# Patient Record
Sex: Female | Born: 1955 | Race: White | Hispanic: No | Marital: Married | Smoking: Never smoker
Health system: Southern US, Community
[De-identification: ages and names within clinical notes are randomized; demographics above are authoritative.]

## PROBLEM LIST (undated history)

## (undated) ENCOUNTER — Ambulatory Visit: Admission: EM

## (undated) DIAGNOSIS — J310 Chronic rhinitis: Secondary | ICD-10-CM

## (undated) DIAGNOSIS — Z8639 Personal history of other endocrine, nutritional and metabolic disease: Secondary | ICD-10-CM

## (undated) DIAGNOSIS — E7800 Pure hypercholesterolemia, unspecified: Secondary | ICD-10-CM

## (undated) DIAGNOSIS — I10 Essential (primary) hypertension: Secondary | ICD-10-CM

## (undated) DIAGNOSIS — E663 Overweight: Secondary | ICD-10-CM

## (undated) DIAGNOSIS — R002 Palpitations: Secondary | ICD-10-CM

## (undated) DIAGNOSIS — R0602 Shortness of breath: Secondary | ICD-10-CM

## (undated) DIAGNOSIS — L719 Rosacea, unspecified: Secondary | ICD-10-CM

## (undated) DIAGNOSIS — I517 Cardiomegaly: Secondary | ICD-10-CM

## (undated) DIAGNOSIS — M2550 Pain in unspecified joint: Secondary | ICD-10-CM

## (undated) DIAGNOSIS — Z862 Personal history of diseases of the blood and blood-forming organs and certain disorders involving the immune mechanism: Secondary | ICD-10-CM

## (undated) DIAGNOSIS — M791 Myalgia, unspecified site: Secondary | ICD-10-CM

## (undated) DIAGNOSIS — M1990 Unspecified osteoarthritis, unspecified site: Secondary | ICD-10-CM

## (undated) DIAGNOSIS — M609 Myositis, unspecified: Secondary | ICD-10-CM

## (undated) DIAGNOSIS — M6208 Separation of muscle (nontraumatic), other site: Secondary | ICD-10-CM

## (undated) DIAGNOSIS — E559 Vitamin D deficiency, unspecified: Secondary | ICD-10-CM

## (undated) DIAGNOSIS — R609 Edema, unspecified: Secondary | ICD-10-CM

## (undated) DIAGNOSIS — F439 Reaction to severe stress, unspecified: Secondary | ICD-10-CM

## (undated) DIAGNOSIS — E039 Hypothyroidism, unspecified: Secondary | ICD-10-CM

## (undated) DIAGNOSIS — E079 Disorder of thyroid, unspecified: Secondary | ICD-10-CM

## (undated) HISTORY — DX: Reaction to severe stress, unspecified: F43.9

## (undated) HISTORY — DX: Shortness of breath: R06.02

## (undated) HISTORY — PX: HAND SURGERY: SHX662

## (undated) HISTORY — PX: FOOT SURGERY: SHX648

## (undated) HISTORY — DX: Personal history of diseases of the blood and blood-forming organs and certain disorders involving the immune mechanism: Z86.2

## (undated) HISTORY — DX: Vitamin D deficiency, unspecified: E55.9

## (undated) HISTORY — DX: Disorder of thyroid, unspecified: E07.9

## (undated) HISTORY — DX: Myalgia, unspecified site: M79.10

## (undated) HISTORY — DX: Edema, unspecified: R60.9

## (undated) HISTORY — DX: Essential (primary) hypertension: I10

## (undated) HISTORY — PX: CERVICAL FUSION: SHX112

## (undated) HISTORY — DX: Palpitations: R00.2

## (undated) HISTORY — DX: Personal history of other endocrine, nutritional and metabolic disease: Z86.39

## (undated) HISTORY — DX: Unspecified osteoarthritis, unspecified site: M19.90

## (undated) HISTORY — DX: Rosacea, unspecified: L71.9

## (undated) HISTORY — DX: Morbid (severe) obesity due to excess calories: E66.01

## (undated) HISTORY — DX: Pain in unspecified joint: M25.50

## (undated) HISTORY — PX: TUBAL LIGATION: SHX77

## (undated) HISTORY — DX: Separation of muscle (nontraumatic), other site: M62.08

## (undated) HISTORY — DX: Chronic rhinitis: J31.0

## (undated) HISTORY — DX: Hypothyroidism, unspecified: E03.9

## (undated) HISTORY — DX: Myositis, unspecified: M60.9

## (undated) HISTORY — DX: Pure hypercholesterolemia, unspecified: E78.00

## (undated) HISTORY — PX: CHOLECYSTECTOMY: SHX55

## (undated) HISTORY — DX: Cardiomegaly: I51.7

## (undated) HISTORY — DX: Overweight: E66.3

---

## 1998-06-23 ENCOUNTER — Ambulatory Visit (HOSPITAL_BASED_OUTPATIENT_CLINIC_OR_DEPARTMENT_OTHER): Admission: RE | Admit: 1998-06-23 | Discharge: 1998-06-23 | Payer: Self-pay | Admitting: Orthopedic Surgery

## 1999-07-04 ENCOUNTER — Encounter: Payer: Self-pay | Admitting: Emergency Medicine

## 1999-07-04 ENCOUNTER — Encounter: Admission: RE | Admit: 1999-07-04 | Discharge: 1999-07-04 | Payer: Self-pay | Admitting: Emergency Medicine

## 2000-02-05 ENCOUNTER — Encounter: Admission: RE | Admit: 2000-02-05 | Discharge: 2000-02-05 | Payer: Self-pay | Admitting: Emergency Medicine

## 2000-02-07 ENCOUNTER — Encounter: Payer: Self-pay | Admitting: Emergency Medicine

## 2000-02-07 ENCOUNTER — Encounter: Admission: RE | Admit: 2000-02-07 | Discharge: 2000-02-07 | Payer: Self-pay | Admitting: Emergency Medicine

## 2000-03-03 ENCOUNTER — Ambulatory Visit (HOSPITAL_BASED_OUTPATIENT_CLINIC_OR_DEPARTMENT_OTHER): Admission: RE | Admit: 2000-03-03 | Discharge: 2000-03-03 | Payer: Self-pay | Admitting: Otolaryngology

## 2000-08-29 ENCOUNTER — Encounter (INDEPENDENT_AMBULATORY_CARE_PROVIDER_SITE_OTHER): Payer: Self-pay

## 2000-08-29 ENCOUNTER — Ambulatory Visit (HOSPITAL_COMMUNITY): Admission: RE | Admit: 2000-08-29 | Discharge: 2000-08-29 | Payer: Self-pay | Admitting: Obstetrics and Gynecology

## 2001-01-07 ENCOUNTER — Encounter: Payer: Self-pay | Admitting: Emergency Medicine

## 2001-01-07 ENCOUNTER — Encounter: Admission: RE | Admit: 2001-01-07 | Discharge: 2001-01-07 | Payer: Self-pay | Admitting: Emergency Medicine

## 2001-11-20 ENCOUNTER — Encounter: Payer: Self-pay | Admitting: Emergency Medicine

## 2001-11-20 ENCOUNTER — Encounter: Admission: RE | Admit: 2001-11-20 | Discharge: 2001-11-20 | Payer: Self-pay | Admitting: Emergency Medicine

## 2002-10-06 ENCOUNTER — Encounter (HOSPITAL_COMMUNITY): Admission: RE | Admit: 2002-10-06 | Discharge: 2003-01-04 | Payer: Self-pay | Admitting: Emergency Medicine

## 2002-10-07 ENCOUNTER — Encounter: Payer: Self-pay | Admitting: Emergency Medicine

## 2002-12-16 ENCOUNTER — Other Ambulatory Visit: Admission: RE | Admit: 2002-12-16 | Discharge: 2002-12-16 | Payer: Self-pay | Admitting: Obstetrics and Gynecology

## 2003-08-16 ENCOUNTER — Encounter: Admission: RE | Admit: 2003-08-16 | Discharge: 2003-08-16 | Payer: Self-pay | Admitting: Emergency Medicine

## 2003-10-12 ENCOUNTER — Encounter: Admission: RE | Admit: 2003-10-12 | Discharge: 2003-10-12 | Payer: Self-pay | Admitting: Emergency Medicine

## 2003-12-06 ENCOUNTER — Other Ambulatory Visit: Admission: RE | Admit: 2003-12-06 | Discharge: 2003-12-06 | Payer: Self-pay | Admitting: Obstetrics and Gynecology

## 2004-06-05 ENCOUNTER — Ambulatory Visit (HOSPITAL_COMMUNITY): Admission: RE | Admit: 2004-06-05 | Discharge: 2004-06-05 | Payer: Self-pay | Admitting: Family Medicine

## 2004-07-22 ENCOUNTER — Ambulatory Visit (HOSPITAL_COMMUNITY): Admission: RE | Admit: 2004-07-22 | Discharge: 2004-07-22 | Payer: Self-pay | Admitting: Family Medicine

## 2005-01-17 ENCOUNTER — Other Ambulatory Visit: Admission: RE | Admit: 2005-01-17 | Discharge: 2005-01-17 | Payer: Self-pay | Admitting: Obstetrics and Gynecology

## 2005-03-05 ENCOUNTER — Encounter: Admission: RE | Admit: 2005-03-05 | Discharge: 2005-03-05 | Payer: Self-pay | Admitting: *Deleted

## 2005-07-25 ENCOUNTER — Ambulatory Visit (HOSPITAL_COMMUNITY): Admission: RE | Admit: 2005-07-25 | Discharge: 2005-07-26 | Payer: Self-pay | Admitting: Neurosurgery

## 2005-12-24 ENCOUNTER — Encounter: Admission: RE | Admit: 2005-12-24 | Discharge: 2005-12-24 | Payer: Self-pay | Admitting: Endocrinology

## 2006-01-25 ENCOUNTER — Encounter: Admission: RE | Admit: 2006-01-25 | Discharge: 2006-01-25 | Payer: Self-pay | Admitting: Endocrinology

## 2007-11-20 ENCOUNTER — Encounter: Payer: Self-pay | Admitting: Cardiology

## 2008-04-27 ENCOUNTER — Encounter: Admission: RE | Admit: 2008-04-27 | Discharge: 2008-04-27 | Payer: Self-pay | Admitting: Family Medicine

## 2009-12-20 ENCOUNTER — Ambulatory Visit: Payer: Self-pay | Admitting: Cardiology

## 2009-12-20 DIAGNOSIS — Z8639 Personal history of other endocrine, nutritional and metabolic disease: Secondary | ICD-10-CM | POA: Insufficient documentation

## 2009-12-20 DIAGNOSIS — R0602 Shortness of breath: Secondary | ICD-10-CM | POA: Insufficient documentation

## 2009-12-20 DIAGNOSIS — I517 Cardiomegaly: Secondary | ICD-10-CM

## 2009-12-20 DIAGNOSIS — R002 Palpitations: Secondary | ICD-10-CM

## 2009-12-20 DIAGNOSIS — I10 Essential (primary) hypertension: Secondary | ICD-10-CM

## 2009-12-20 DIAGNOSIS — Z862 Personal history of diseases of the blood and blood-forming organs and certain disorders involving the immune mechanism: Secondary | ICD-10-CM

## 2009-12-20 DIAGNOSIS — E663 Overweight: Secondary | ICD-10-CM

## 2009-12-20 HISTORY — DX: Palpitations: R00.2

## 2009-12-20 HISTORY — DX: Overweight: E66.3

## 2009-12-20 HISTORY — DX: Personal history of diseases of the blood and blood-forming organs and certain disorders involving the immune mechanism: Z86.2

## 2009-12-20 HISTORY — DX: Shortness of breath: R06.02

## 2009-12-26 ENCOUNTER — Encounter: Payer: Self-pay | Admitting: Cardiology

## 2009-12-26 ENCOUNTER — Ambulatory Visit: Payer: Self-pay | Admitting: Cardiology

## 2009-12-26 ENCOUNTER — Ambulatory Visit: Payer: Self-pay

## 2009-12-26 ENCOUNTER — Ambulatory Visit (HOSPITAL_COMMUNITY): Admission: RE | Admit: 2009-12-26 | Discharge: 2009-12-26 | Payer: Self-pay | Admitting: Cardiology

## 2010-03-10 ENCOUNTER — Ambulatory Visit: Payer: Self-pay | Admitting: Cardiology

## 2010-03-22 ENCOUNTER — Encounter: Admission: RE | Admit: 2010-03-22 | Payer: Self-pay | Source: Home / Self Care | Admitting: Family Medicine

## 2010-03-30 ENCOUNTER — Encounter
Admission: RE | Admit: 2010-03-30 | Discharge: 2010-03-30 | Payer: Self-pay | Source: Home / Self Care | Attending: Family Medicine | Admitting: Family Medicine

## 2010-04-10 ENCOUNTER — Encounter (INDEPENDENT_AMBULATORY_CARE_PROVIDER_SITE_OTHER): Payer: Self-pay | Admitting: *Deleted

## 2010-04-27 NOTE — Letter (Signed)
Summary: [facility]  [facility]   Imported By: Marylou Mccoy 01/02/2010 07:32:59  _____________________________________________________________________  External Attachment:    Type:   Image     Comment:   External Document

## 2010-04-27 NOTE — Letter (Signed)
Summary: Appointment - Missed  [facility], Main Office  [address]   [place], [place] [address]   Phone: [phone]  Fax: [phone]     April 10, 2010 MRN: [id number]   [facility] [address] [place], [place]  [address]   Dear Ms. LORGE,  Our records indicate you missed your appointment on 03-10-2010 with  Dr. Daleen Squibb. It is very important that we reach you to reschedule this appointment. We look forward to participating in your health care needs. Please contact us at the number listed above at your earliest convenience to reschedule this appointment.     Sincerely,   Lorne Skeens  [facility] Scheduling Team

## 2010-04-27 NOTE — Assessment & Plan Note (Signed)
Summary: np6/enlarged heart CX & echo/htn   Visit Type:  new pt visit Referring [name]:  [place] Primary [name]:  [name]  CC:  palpitations...sob...edema/ankles..pt states she has been told that she has a slightly enlarged heart.  History of Present Illness: Deanna [name] is a 55 year old white female referred by Dr. Arelia Sneddon for cardimegaly.   She has had hypertension for 11 years. She has been overweight since her mid-20s after having children. She does have dependent edema at the end of the day the resolves at night. She denies orthopnea or PND. She has thyroid disease pain managed by endocrinology. She is not diabetic.  She does not smoke or drink alcohol. No history of illicit drugs.  She does get short of breath she walked fast even on flat ground. She is compliant with her medications.  Preventive Screening-Counseling & Management  Alcohol-Tobacco     Smoking Status: never  Caffeine-Diet-Exercise     Does Patient Exercise: no      Drug Use:  no.    Current Medications (verified): 1)  Hyzaar 100-25 Mg Tabs (Losartan Potassium-Hctz) .Marland Kitchen.. 1 Tab Once Daily 2)  Vitamin D3 2000 Unit Caps (Cholecalciferol) .... 2 Caps Once Daily 3)  Synthroid 137 Mcg Tabs (Levothyroxine Sodium) .Marland Kitchen.. 1 Tab Once Daily  Allergies (verified): No Known Drug Allergies  Past History:  Family History: Last updated: 12/20/2009 Mother: Hypertrophic cardiomyopathy Family History of Diabetes:  Family History of Hyperlipidemia:  Family History of Hypertension:  Father: deceased 35...due to CHF  Social History: Last updated: 12/20/2009 Full Time Married  Tobacco Use - No.  Alcohol Use - no Regular Exercise - no Drug Use - no  Risk Factors: Exercise: no (12/20/2009)  Risk Factors: Smoking Status: never (12/20/2009)  Past Medical History: SHORTNESS OF BREATH (ICD-786.05) PALPITATIONS (ICD-785.1) HYPERTENSION (ICD-401.9) THYROID DISEASE, HX OF (ICD-V12.2)  Family  History: Mother: Hypertrophic cardiomyopathy Family History of Diabetes:  Family History of Hyperlipidemia:  Family History of Hypertension:  Father: deceased 26...due to CHF  Social History: Full Time Married  Tobacco Use - No.  Alcohol Use - no Regular Exercise - no Drug Use - no Smoking Status:  never Does Patient Exercise:  no Drug Use:  no  Review of Systems       negative other than history of present illness  Vital Signs:  Patient profile:   55 year old female Height:      65.5 inches Weight:      247.8 pounds BMI:     40.76 Pulse rate:   73 / minute Pulse rhythm:   irregular BP sitting:   116 / 80  (left arm) Cuff size:   large  Vitals Entered By: Danielle Rankin, CMA (December 20, 2009 9:08 AM)  Physical Exam  General:  obese.  no acute distress, pleasantobese.   Head:  normocephalic and atraumatic Eyes:  PERRLA/EOM intact; conjunctiva and lids normal. Neck:  Neck supple, no JVD. No masses, thyromegaly or abnormal cervical nodes. Chest Wall:  no deformities or breast masses noted Lungs:  clear to auscultation percussion Heart:  PMI nondisplaced, regular rate and rhythm, normal S1-S2 splits. No gallop or murmur. No obvious right ventricular lift Abdomen:  positive bowel sounds, no obvious abnormality Msk:  Back normal, normal gait. Muscle strength and tone normal. Pulses:  pulses normal in all 4 extremities Extremities:  No clubbing or cyanosis. Neurologic:  Alert and oriented x 3. Skin:  Intact without lesions or rashes. Psych:  Normal affect.   Problems:  Medical  Problems Added: 1)  Dx of Cardiomegaly  (ICD-429.3) 2)  Dx of Overweight/obesity  (ICD-278.02) 3)  Dx of Shortness of Breath  (ICD-786.05) 4)  Dx of Palpitations  (ICD-785.1) 5)  Dx of Hypertension  (ICD-401.9) 6)  Dx of Thyroid Disease, Hx of  (ICD-V12.2)  EKG  Procedure date:  12/20/2009  Findings:      normal sinus rhythm with some PACs, low voltage anteriorly probably secondary  to her weight.  Impression & Recommendations:  Problem # 1:  CARDIOMEGALY (ICD-429.3) Will check Echo. If LVH and or LV enlargement will add Carvedilol.  Problem # 2:  OVERWEIGHT/OBESITY (ICD-278.02) Assessment: Deteriorated Weight loss of 30-50 pounds imperative. Walking 3 hrs a week.  Problem # 3:  HYPERTENSION (ICD-401.9) See above for management. Her updated medication list for this problem includes:    Hyzaar 100-25 Mg Tabs (Losartan potassium-hctz) .Marland Kitchen... 1 tab once daily  Problem # 4:  PALPITATIONS (ICD-785.1)  Problem # 5:  THYROID DISEASE, HX OF (ICD-V12.2)  Other Orders: EKG w/ Interpretation (93000) Echocardiogram (Echo)  Patient Instructions: 1)  Your physician recommends that you schedule a follow-up appointment in: 3 months 2)  Your physician has requested that you have an echocardiogram.  Echocardiography is a painless test that uses sound waves to create images of your heart. It provides your doctor with information about the size and shape of your heart and how well your heart's chambers and valves are working.  This procedure takes approximately one hour. There are no restrictions for this procedure. 3)  Your physician encouraged you to lose weight for better health. Dr. Daleen Squibb would like you to lose 4 pounds per month to begin with.  Initial weight loss goal to be 25 pounds with end goal of 50 pound weight loss. 4)  Begin walking program. Gradually increase to 3 hours per week. 5)  Follow 3 gm sodium diet. You were given information sheet on this.  Appended Document: np6/enlarged heart CX & echo/htn ok  Reviewed Juanito Doom, MD  Appended Document: np6/enlarged heart CX & echo/htn ok  Reviewed Juanito Doom, MD

## 2010-08-11 NOTE — H&P (Signed)
[facility]  Patient:    Deanna [name], Deanna [name]                     MRN: [id number] Adm. Date:  [id number] Attending:  Frederich Balding                         History and Physical  CHIEF COMPLAINT:              The patient is a 55 year old gravida seven para five, 1-1-5, married white female who presents for laparoscopic bilateral tubal ligation as well as hysteroscopy.  HISTORY OF PRESENT ILLNESS:   In relation to the present admission, the patient is desirous of permanent sterilization.  Alternatives for birth control were discussed.  The potential irreversibility of sterilization was explained and a failure rate of 1/200 was quoted and pregnancy can be in the form of ectopic pregnancy requiring further surgical management.                                The reason hysteroscopy is being done is that the patient describes cycles every 21 days.  She has two to three days of fairly heavy flow, changing pads and tampons three or four times per day, with clots.  She has had increasing pelvic pain and dysmenorrhea off and on, although that has not been a problem recently.  We have been somewhat limited in terms of options for management as she does have hypertension and is being actively managed by her family doctor, although she is not on any medications at the present time.  ALLERGIES:                    MOTRIN.  MEDICATIONS:                  None at the present time.  PAST MEDICAL HISTORY:         Usual childhood diseases without significant sequelae.  PAST SURGICAL HISTORY:        Previous cholecystectomy.  OBSTETRICAL HISTORY:          1. Spontaneous abortion in 1974.                               2. Spontaneous vaginal delivery in 1975, 1978,                                  1981, and 1988.                               3. D&E for nonviable pregnancy at 22 weeks in                                  1995.                               4.  Subsequent spontaneous vaginal delivery in  1997. SOCIAL HISTORY:               No tobacco or alcohol use.  FAMILY HISTORY:               Noncontributory.  REVIEW OF SYSTEMS:            Noncontributory.  PHYSICAL EXAMINATION:  VITAL SIGNS:                  The patient is afebrile with stable vital signs.  HEENT:                        Normocephalic.  PERRLA.  EOMI.  Sclerae and conjunctivae clear.  Oropharynx clear.  NECK:                         Without thyromegaly.  BREAST:                       No discrete masses.  LUNGS:                        Clear.  CARDIAC:                      Regular rate and rhythm without murmurs or gallops.  ABDOMEN:                      Benign examination.  No masses, organomegaly, or tenderness.  PELVIC:                       Normal external genitalia.  Vaginal mucosa clear.  Cervix unremarkable.  Uterus feels to be normal size and shape. Adnexa unremarkable.  EXTREMITIES:                  Trace edema.  NEUROLOGIC:                   Grossly within normal limits.  IMPRESSION:                   1. Multiparity with desire for sterility.                               2. Abnormal uterine bleeding.  PLAN:                         The patient will undergo hysteroscopy along with diagnostic laparoscopy with laser standby.  The risks of surgery have been discussed including the risk of infection, the risk of hemorrhage that could necessitate transfusion with the risk of AIDS or hepatitis, vascular injury that could also require exploratory surgery for management, risk of injury to adjacent organs such as bladder or bowel that could require further exploratory surgery, risk of deep vein thrombosis and pulmonary embolus.  The patient expressed understanding of the indications and risks. DD:  08/29/00 TD:  08/29/00 Job: [id number]

## 2010-08-11 NOTE — Op Note (Signed)
[facility]  Patient:    Deanna [name], Deanna [name]                     MRN: [id number] Proc. Date: 08/29/00 Adm. Date:  [id number] Attending:  Frederich Balding                           Operative Report  PREOPERATIVE DIAGNOSES:       1. Multiparity, desires sterility.                               2. Abnormal uterine bleeding.  POSTOPERATIVE DIAGNOSES:      1. Multiparity, desires sterility.                               2. Abnormal uterine bleeding.                               3. Evidence of pelvic endometriosis and                                  adenomyosis.                               4. Periumbilical and omental adhesions.  OPERATION:                    1. Hysteroscopy with dilatation and curettage.                               2. Open laparoscopy.                               3. Lysis of adhesions.                               4. Bilateral tubal cautery.                               5. Cautery of endometriotic implants.  SURGEON:                      Juluis Mire, M.D.  ANESTHESIA:                   General endotracheal anesthesia.  ESTIMATED BLOOD LOSS:         Minimal.  PACKS AND DRAINS:             None.  INTRAOPERATIVE BLOOD REPLACEMENT: None.  COMPLICATIONS:                None.  INDICATIONS:                  Dictated in History & Physical.  DESCRIPTION OF PROCEDURE:     The patient was taken to the OR and placed in the supine position.  After satisfactory level of general endotracheal anesthesia was obtained, the patient was placed in the dorsolithotomy  position using the [employer].  The abdomen, peritoneum, and vagina were prepped and draped with Betadine.  The patient was draped out for hysteroscopy. Examination under anesthesia revealed the uterus to be upper limits of normal size and normal shape.  Adnexa unremarkable.  A speculum was placed in the vaginal vault.  The cervix was grasped with a single-tooth  tenaculum.  The uterus sounded to approximately 9 cm.  The cervix was serially dilated to a size 29 Pratt dilator.  The hysteroscope was then introduced.  Intrauterine cavity was distended using Sorbitol.  There was no evidence of any type of intrauterine pathology.  No polyps or fibroids were noted.  Endometrial curettings were obtained and sent for pathological review.  The Hulka tenaculum was then put in place.  The single-tooth tenaculum and speculum were then removed.  The bladder was emptied by in and out catheterization.  The patient was then made ready for laparoscopy.  A subumbilical incision was made with the knife.  This was extended through the subcutaneous tissue.  The fascia was identified and entered sharply and incision in fascia extended laterally.  At this point in time, we found that she had periumbilical omental adhesions.  No bowel was noted.  Two lateral sutures of 0 Vicryl were put in place and held in the fascia.  The Hasson cannula was put in place and secured with the held sutures.  The abdomen was insufflated with carbon dioxide.  The laparoscope was introduced.  The periumbilical area was encircled with omentum.  No bowel was involved.  We were able to find a small window and pushed the laparoscope  through that area.  We then could see into the pelvis.  The uterus was markedly enlarged and soft and boggy consistent with adenomyosis.  She did have several implants of endometriosis in the cul-de-sac.  Tubes and ovaries were unremarkable.  Using the bipolar, each tube was cauterized for a distance of 2 cm.  Cautery was continued until the resistance read 0 on the meter.  The cautery did extend out to the mesosalpinx.  We then again cauterized the same segment of tube completely desiccating it.  Both tubes were adequately cauterized.  The one implant of endometriosis in the cul-de-sac was also cauterized.  At this point in time, there was no active bleeding or  evidence of injury to adjacent organs.  It is of note that we did put in a 5 mm trocar in the suprapubic area under direct visualization.  At this point in time, the abdomen was deflated of carbon dioxide.  All trocars were removed.   The subumbilical fascia was closed with a figure-of-eight of 0 Vicryl, subcutaneous tissue was closed with 0 Vicryl; skin was closed with interrupted subcuticular of 4-0 Vicryl.  Suprapubic incision was closed with Steri-Strips.  The Hulka tenaculum was then removed. The patient was taken out of the dorsolithotomy position.  Once alert and extubated, transferred to the recovery room in good condition.  Sponge, instrument, and needle counts were reported as correct by the circulating nurse x 2. DD:  08/29/00 TD:  08/29/00 Job: [id number]

## 2010-08-11 NOTE — Op Note (Signed)
NAMEANBERLIN, [name]              ACCOUNT NO.:  [id number]   MEDICAL RECORD NO.:  [id number]          PATIENT TYPE:  AMB   LOCATION:  SDS                          FACILITY:  [facility]   PHYSICIAN:  Deanna [name], M.D.     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   POSTOPERATIVE DIAGNOSIS:  Cervical spondylosis C5-6, C6-7; cervical  degenerative disk disease C5-6, C6-7; cervical stenosis neck pain.   PROCEDURE:  1.  Anterior cervical decompression C5-6, C6-7.  2.  Arthrodesis C5-6, C6-7 with two PEAK cages 6 mm, 2 mm Stryker cage at C5-      6 and 7 mm at C6-7 filled with Osteocel and morselized allograft.  3.  Anterior plating Reflex Hybrid 32 mm plate, 14 mm screws two at C5, two      at C6, two at C7.   COMPLICATIONS:  None.   SURGEON:  Deanna [name], M.D.   ASSISTANT:  Deanna [name], M.D.   INDICATIONS:  Deanna [name] is a 55 year old woman who presented to me for  neck pain.  She had severe spondylitic changes in the cervical spine.  She  tried physical therapy without relief.  I therefore recommended and she  agreed to undergo an operative decompression and subsequent arthrodesis.   OPERATIVE NOTE:  Deanna [name] was brought to the operating room intubated and  placed under general anesthetic without difficulty.  Her head was positioned  essentially neutrally on a horseshoe head rest.  Her neck was prepped and  she was draped in a sterile fashion.  I infiltrated 4 mL 0.5% lidocaine  1:200,000 strength epinephrine approximately three fingerbreadths above the  sternal notch starting from the midline extending to the medial border of  the left sternocleidomastoid muscle.   I opened the skin with a #10 blade and I took this down to the platysma  sharply.   I dissected in a plane above the platysma, rostrally and caudally  using Metzenbaum scissors.  I opened the platysma horizontally and then  dissected in a plane inferior to the platysma rostrally and caudally.  I  then again using sharp dissection, created a plane to the anterior cervical  spine between the strap muscles and the sternocleidomastoid and carotid  artery, taken laterally.  The omohyoid was also brought laterally.   I placed a spinal needle and it was identified and I was able to locate it  to the C5-6 space.  I then reflected the longus colli muscles bilaterally.  I placed the self-retaining retractor.  I opened the disk spaces at C5-6 and  at C6-7 using a #15 blade.  I used a curette to remove some of the disk  material. I  then placed distraction pins, one at C5, one at C6 and  distracted that space.  I brought the microscope into the operative field  and commenced with the decompression.   The anterior  cervical decompression at C5-6 was achieved using curettes,  pituitary rongeurs and a high speed drill along with pituitary rongeurs.  There is a great deal of osteophyte formation posteriorly which I used a  drill to slowly whittle away.  When I was thinned, I then used a Kerrison  punch to remove the bone.  The ligament was quite thick and I removed that  with a Kerrison punch also.  I identified the dura and then using that as a  plane, removed the soft tissue bilaterally so that the thecal sac was well  decompressed and that both C6 nerve roots were well decompressed.  When that  was finished, I measured the disk space and felt that a 6 mm PEAK implant  would work best.  I filled that with allograft chips and Osteocel, which was  mixed together.  I then placed that into the disk space.  Some Gelfoam was  placed around the outside of the implant.  I removed the distraction pin at  C5.  I placed that distraction pin at C7.  I distracted that space and then  turned my  attention to the C6-7 space.  Then using the same combination of  drills, curettes, rongeurs and Kerrison punches, I decompressed the spinal  canal at that level along with both C7 nerve roots.  Again, very large  osteophytes were found posteriorly especially off C7 vertebral body.  But  with Dr. Verlee [name] assistance, I was able to effect very good decompression  of both C7 nerve roots.   I then prepared the end plates for arthrodesis at C6-7 by using a high speed  drill.  I drilled down some irregularities on the surface to make a smooth  place for the graft for the PEAK interbody to sit.  I filled the 7 mm  interbody graft and then placed that.  This was Stryker instrumentation.  I  then  removed the distraction pins at C6 and at C7.  Then with Dr. Verlee [name]  assistance, we placed the cervical plate.   I placed the plate first by drilling then using self tapping screws.  Two  screws were placed at C5, two at C6, two at C7.  Screws were all placed  without difficulty.  Final tightening was used to good locking into the  plate system.  This was a reflex Hybrid plate.  I then irrigated the wound.  Another x-ray was performed and it showed the plate to be in the correct  position as were the screws.  I could not actually see the C7 body but the  first set of screws was at C5.  I then irrigated the wound.  I then closed  the wound in layered fashion using Vicryl sutures.  Dermabond was used for a  sterile dressing.  Deanna [name] tolerated the procedure well.           ______________________________  Deanna [name], M.D.     KC/MEDQ  D:  07/25/2005  T:  07/26/2005  Job:  [id number]

## 2011-12-11 ENCOUNTER — Other Ambulatory Visit: Payer: Self-pay | Admitting: Family Medicine

## 2011-12-11 DIAGNOSIS — M5431 Sciatica, right side: Secondary | ICD-10-CM

## 2011-12-17 ENCOUNTER — Ambulatory Visit
Admission: RE | Admit: 2011-12-17 | Discharge: 2011-12-17 | Disposition: A | Discharge status: Home / Self Care | Payer: BC Managed Care – PPO | Source: Ambulatory Visit | Attending: Family Medicine | Admitting: Family Medicine

## 2011-12-17 ENCOUNTER — Other Ambulatory Visit: Payer: Self-pay

## 2011-12-17 DIAGNOSIS — M5431 Sciatica, right side: Secondary | ICD-10-CM

## 2011-12-17 MED ORDER — GADOBENATE DIMEGLUMINE 529 MG/ML IV SOLN
20.0000 mL | Freq: Once | INTRAVENOUS | Status: AC | PRN
  Administered 2011-12-17: 20 mL via INTRAVENOUS

## 2013-01-30 ENCOUNTER — Telehealth: Payer: Self-pay | Admitting: *Deleted

## 2013-01-30 NOTE — Telephone Encounter (Signed)
Pt states old surgery foot of 15 years ago, started hurting, redness and swelling, with a knot on the bottom.  Transferred to scheduler to be scheduled as soon as possible.

## 2013-02-03 ENCOUNTER — Telehealth: Payer: Self-pay | Admitting: *Deleted

## 2013-02-03 NOTE — Telephone Encounter (Signed)
Pt states still having problems with her right foot, was only able to get appt here on 02/11/2013, and her orthopedic doctor even later.  I checked with Carley Hammed and she states can get pt in 02/04/2013 at 830am.  Pt was transferred to [facility] to schedule.

## 2013-02-04 ENCOUNTER — Ambulatory Visit (INDEPENDENT_AMBULATORY_CARE_PROVIDER_SITE_OTHER): Payer: BC Managed Care – PPO

## 2013-02-04 ENCOUNTER — Ambulatory Visit (INDEPENDENT_AMBULATORY_CARE_PROVIDER_SITE_OTHER): Payer: BC Managed Care – PPO | Admitting: Podiatry

## 2013-02-04 ENCOUNTER — Encounter: Payer: Self-pay | Admitting: Podiatry

## 2013-02-04 VITALS — BP 136/77 | HR 85 | Resp 18 | Ht 65.0 in | Wt 240.0 lb

## 2013-02-04 DIAGNOSIS — M79671 Pain in right foot: Secondary | ICD-10-CM

## 2013-02-04 DIAGNOSIS — M79609 Pain in unspecified limb: Secondary | ICD-10-CM

## 2013-02-04 DIAGNOSIS — M8448XA Pathological fracture, other site, initial encounter for fracture: Secondary | ICD-10-CM

## 2013-02-04 NOTE — Progress Notes (Signed)
Subjective:     Patient ID: Deanna [name], female   DOB: [birth year], 57 y.o.   MRN: [id number]  Foot Pain   patient points to right foot stating the top of my foot has been hurting me quite a bit for the last 10 days. Does not remember any specific injury that occurred just pain   Review of Systems  All other systems reviewed and are negative.       Objective:   Physical Exam  Nursing note and vitals reviewed. Constitutional: She is oriented to person, place, and time.  Cardiovascular: Intact distal pulses.   Musculoskeletal: Normal range of motion.  Neurological: She is oriented to person, place, and time.  Skin: Skin is warm.   patient has discomfort on the second metatarsal distal shaft and into the metatarsophalangeal joint. I noted edema in the area and no other points of pain. Neurovascular status intact and negative Homans sign noted     Assessment:     Possible stress fracture versus tendinitis inflammation dorsal right foot    Plan:     X-ray reviewed and at this time advised on air fracture walker usage which she has along with ice therapy. Reappoint in 3 weeks to recheck

## 2013-02-11 ENCOUNTER — Ambulatory Visit: Payer: Self-pay | Admitting: Podiatry

## 2013-02-25 ENCOUNTER — Ambulatory Visit (INDEPENDENT_AMBULATORY_CARE_PROVIDER_SITE_OTHER): Payer: BC Managed Care – PPO

## 2013-02-25 ENCOUNTER — Ambulatory Visit (INDEPENDENT_AMBULATORY_CARE_PROVIDER_SITE_OTHER): Payer: BC Managed Care – PPO | Admitting: Podiatry

## 2013-02-25 ENCOUNTER — Encounter: Payer: Self-pay | Admitting: Podiatry

## 2013-02-25 VITALS — BP 121/76 | HR 70 | Resp 12

## 2013-02-25 DIAGNOSIS — M84374D Stress fracture, right foot, subsequent encounter for fracture with routine healing: Secondary | ICD-10-CM

## 2013-02-25 DIAGNOSIS — R609 Edema, unspecified: Secondary | ICD-10-CM

## 2013-02-25 DIAGNOSIS — Z4789 Encounter for other orthopedic aftercare: Secondary | ICD-10-CM

## 2013-02-25 DIAGNOSIS — M7750 Other enthesopathy of unspecified foot: Secondary | ICD-10-CM

## 2013-02-25 MED ORDER — TRIAMCINOLONE ACETONIDE 10 MG/ML IJ SUSP
10.0000 mg | Freq: Once | INTRAMUSCULAR | Status: AC
  Administered 2013-02-25: 10 mg

## 2013-02-25 NOTE — Progress Notes (Signed)
Subjective:     Patient ID: Deanna [name], female   DOB: [birth year], 57 y.o.   MRN: [id number]  HPI patient presents stating my pain is nowhere near as bad but I still get irritation on top of my right foot   Review of Systems     Objective:   Physical Exam Neurovascular status intact with moderate discomfort noted dorsum of the right foot without one particular area being the worse. No other significant pathology    Assessment:     Cannot rule out stress fracture versus dorsal tendinitis inflammation    Plan:     X-ray was taken and negative for stress fracture. At this time I injected dorsal tissue to renograms Kenalog 5 of Xylocaine mixture and advised on continued usage for the next week or 2. Hopefully this will reduce the edema and also dispense Ace wrap with instructions on usage

## 2013-10-08 ENCOUNTER — Encounter: Payer: Self-pay | Admitting: *Deleted

## 2013-10-19 ENCOUNTER — Ambulatory Visit (INDEPENDENT_AMBULATORY_CARE_PROVIDER_SITE_OTHER): Payer: BC Managed Care – PPO | Admitting: Cardiology

## 2013-10-19 ENCOUNTER — Encounter: Payer: Self-pay | Admitting: Cardiology

## 2013-10-19 VITALS — BP 112/76 | HR 64 | Ht 65.0 in | Wt 248.0 lb

## 2013-10-19 DIAGNOSIS — R0600 Dyspnea, unspecified: Secondary | ICD-10-CM | POA: Insufficient documentation

## 2013-10-19 DIAGNOSIS — E785 Hyperlipidemia, unspecified: Secondary | ICD-10-CM | POA: Insufficient documentation

## 2013-10-19 DIAGNOSIS — Z862 Personal history of diseases of the blood and blood-forming organs and certain disorders involving the immune mechanism: Secondary | ICD-10-CM

## 2013-10-19 DIAGNOSIS — I10 Essential (primary) hypertension: Secondary | ICD-10-CM

## 2013-10-19 DIAGNOSIS — Z8639 Personal history of other endocrine, nutritional and metabolic disease: Secondary | ICD-10-CM

## 2013-10-19 DIAGNOSIS — R0602 Shortness of breath: Secondary | ICD-10-CM

## 2013-10-19 DIAGNOSIS — I517 Cardiomegaly: Secondary | ICD-10-CM

## 2013-10-19 DIAGNOSIS — R0609 Other forms of dyspnea: Secondary | ICD-10-CM | POA: Insufficient documentation

## 2013-10-19 DIAGNOSIS — R000 Tachycardia, unspecified: Secondary | ICD-10-CM

## 2013-10-19 DIAGNOSIS — Z8249 Family history of ischemic heart disease and other diseases of the circulatory system: Secondary | ICD-10-CM | POA: Insufficient documentation

## 2013-10-19 DIAGNOSIS — R002 Palpitations: Secondary | ICD-10-CM

## 2013-10-19 MED ORDER — TRAMADOL HCL 50 MG PO TABS: 25.0000 mg | ORAL_TABLET | Freq: Two times a day (BID) | ORAL | Status: DC | PRN

## 2013-10-19 NOTE — Progress Notes (Signed)
Patient ID: Deanna [name], female   DOB: 08-Jul-1955, 58 y.o.   MRN: [id number]     Patient Name: Deanna [name] Date of Encounter: 10/19/2013  Primary Care Provider:  Sissy [name],Deanna W, MD Primary Cardiologist:  Deanna [name], Deanna [name], previously Deanna [name]  Problem List   Past Medical History  Diagnosis Date  . Thyroid disease   . Hypertension   . Cardiomegaly   . OVERWEIGHT/OBESITY 12/20/2009    Qualifier: Diagnosis of  By: Daleen SquibbWall, MD, Deanna [name], Deanna [name]   . Palpitations 12/20/2009    Qualifier: Diagnosis of  By: Daleen SquibbWall, MD, Deanna [name], Deanna [name]   . Shortness of breath 12/20/2009    Qualifier: Diagnosis of  By: Daleen SquibbWall, MD, Deanna [name], Deanna [name]   . THYROID DISEASE, HX OF 12/20/2009    Qualifier: Diagnosis of  By: Daleen SquibbWall, MD, Deanna [name], Deanna [name]    Past Surgical History  Procedure Laterality Date  . Foot surgery    . Tubal ligation    . Cervical fusion    . Hand surgery    . Cholecystectomy      Allergies  No Known Allergies  HPI  Deanna [name]: "palpitations...sob...edema/ankles..pt states she has been told that she has a slightly enlarged heart.  History of Present Illness:  Deanna [name] is a 58 year old white female referred by Deanna. Arelia [name] for cardimegaly.  She has had hypertension for 11 years. She has been overweight since her mid-20s after having children. She does have dependent edema at the end of the day the resolves at night. She denies orthopnea or PND. She has thyroid disease pain managed by endocrinology. She is not diabetic.  She does not smoke or drink alcohol. No history of illicit drugs.  She does get short of breath she walked fast even on flat ground. She is compliant with her medications."  The patient is coming after 4 years. She continues having palpitations that can happen several times a week. They are not associated chest pain or shortness of breath but on forced her to cough after which palpitations would discontinue. She's also concerned about dyspnea on exertion after walking about one  block. She mentions that she has significant family history of heart disease with her father dying of congestive heart failure in his early 3860s and her mother being diagnosed with hypertrophic cardiomyopathy. She denies any syncope. The patient also complaining of lower extremity edema, it started after she was placed on Deanna [name] in April of this year. She stopped taking it and her swelling has resolved. She had her labs checked in April with creatinine 0.7.   Home Medications  Prior to Admission medications   Medication Sig Start Date End Date Taking? Authorizing Provider  levothyroxine (SYNTHROID, LEVOTHROID) 150 MCG tablet Take 150 mcg by mouth daily before breakfast.   Yes Historical Provider, MD  losartan-hydrochlorothiazide (HYZAAR) 100-25 MG per tablet Take 1 tablet by mouth daily.   Yes Historical Provider, MD    Family History  Family History  Problem Relation Age of Onset  . Diabetes    . Hypertension    . Hyperlipidemia    . Congestive Heart Failure Father [id number]    Social History  History   Social History  . Marital Status: Married    Spouse Name: N/A    Number of Children: N/A  . Years of Education: N/A   Occupational History  . Not on file.   Social History Main Topics  . Smoking status: Never Smoker   . Smokeless tobacco: Not on  file  . Alcohol Use: No  . Drug Use: Not on file  . Sexual Activity: Not on file   Other Topics Concern  . Not on file   Social History Narrative  . No narrative on file     Review of Systems, as per HPI, otherwise negative General:  No chills, fever, night sweats or weight changes.  Cardiovascular:  No chest pain, dyspnea on exertion, edema, orthopnea, palpitations, paroxysmal nocturnal dyspnea. Dermatological: No rash, lesions/masses Respiratory: No cough, dyspnea Urologic: No hematuria, dysuria Abdominal:   No nausea, vomiting, diarrhea, bright red blood per rectum, melena, or hematemesis Neurologic:  No visual changes,  wkns, changes in mental status. All other systems reviewed and are otherwise negative except as noted above.  Physical Exam  Blood pressure 112/76, pulse 64, height 5\' 5"  (1.651 m), weight 248 lb (112.492 kg).  General: Pleasant, NAD Psych: Normal affect. Neuro: Alert and oriented X 3. Moves all extremities spontaneously. HEENT: Normal  Neck: Supple without bruits or JVD. Lungs:  Resp regular and unlabored, CTA. Heart: RRR no s3, s4, or murmurs. Abdomen: Soft, non-tender, non-distended, BS + x 4.  Extremities: No clubbing, cyanosis or edema. DP/PT/Radials 2+ and equal bilaterally.  Labs: None  Accessory Clinical Findings  Echocardiogram - 12/26/2013 Left ventricle: The cavity size was normal. Wall thickness was increased in a pattern of mild LVH. The estimated ejection fraction was 60%. Wall motion was normal; there were no regional wall motion abnormalities. -------------------------------------------------------------------- Aortic valve: Structurally normal valve. Cusp separation was normal. Doppler: Transvalvular velocity was within the normal range. There was no stenosis. No regurgitation. -------------------------------------------------------------------- Aorta: Aortic root: The aortic root was normal in size. -------------------------------------------------------------------- Mitral valve: Structurally normal valve. Leaflet separation was normal. Doppler: Transvalvular velocity was within the normal range. There was no evidence for stenosis. Trivial regurgitation. -------------------------------------------------------------------- Left atrium: The atrium was normal in size. -------------------------------------------------------------------- Right ventricle: There may be mild dilitation of the RV. There may be mild hypokinesis of the RV. -------------------------------------------------------------------- Pulmonic valve: Poorly visualized. Doppler: No  significant regurgitation. -------------------------------------------------------------------- Tricuspid valve: Structurally normal valve. Leaflet separation was normal. Doppler: Transvalvular velocity was within the normal range. Trivial regurgitation. -------------------------------------------------------------------- Right atrium: The atrium was at the upper limits of normal in size. ------------------------------------------------------------------- Pericardium: There was no pericardial effusion.  ECG - normal sinus rhythm, 64 beats per minute, normal EKG.    Assessment & Plan  58 year old female  1. LVH - on antihypertensives since 2007, history of hypertrophic cardiomyopathy and her mother and inconclusive echocardiogram in 2011. We will repeat echocardiogram to evaluate for degree of LVH and any signs of hypertrophic cardiomyopathy.  2. Hypertension - well controlled on current medication  3. Hyperlipidemia - LDL 113, HDL 46, triglycerides 110. Wall is less than 100 considering she does not history of coronary artery disease we'll for now treat medically.  4. Dyspnea on exertion - we will order a stress echocardiogram to rule out ischemia and to evaluate for functional capacity.  5. Palpitations - we'll order a 48 hour Holter monitor.  6. lower back pain - improved with Deanna [name] that significant lower extremity edema, we will discontinue Deanna [name] and start tramadol 25 mg twice a day when necessary. We will check kidney function today.  7. Obesity - encouraged to exercise.  Followup in 2 months   Deanna Masson, MD, [facility] 10/19/2013, 2:53 PM

## 2013-10-19 NOTE — Patient Instructions (Addendum)
**Note De-Identified Deanna [name] Obfuscation** Your physician has recommended you make the following change in your medication: start taking Tramadol 25 mg twice daily as needed for pain  Your physician has requested that you have a stress echocardiogram. For further information please visit [email or web address]. Please follow instruction sheet as given.  Your physician has requested that you have an echocardiogram. Echocardiography is a painless test that uses sound waves to create images of your heart. It provides your doctor with information about the size and shape of your heart and how well your heart's chambers and valves are working. This procedure takes approximately one hour. There are no restrictions for this procedure.  Your physician recommends that you return for lab work in: today  Your physician has recommended that you wear a 48 hour holter monitor. Holter monitors are medical devices that record the heart's electrical activity. Doctors most often use these monitors to diagnose arrhythmias. Arrhythmias are problems with the speed or rhythm of the heartbeat. The monitor is a small, portable device. You can wear one while you do your normal daily activities. This is usually used to diagnose what is causing palpitations/syncope (passing out).  Your physician recommends that you schedule a follow-up appointment in: 2 months

## 2013-10-21 ENCOUNTER — Encounter: Payer: Self-pay | Admitting: *Deleted

## 2013-10-21 ENCOUNTER — Encounter (INDEPENDENT_AMBULATORY_CARE_PROVIDER_SITE_OTHER): Payer: BC Managed Care – PPO

## 2013-10-21 ENCOUNTER — Ambulatory Visit (INDEPENDENT_AMBULATORY_CARE_PROVIDER_SITE_OTHER): Payer: BC Managed Care – PPO | Admitting: *Deleted

## 2013-10-21 DIAGNOSIS — E785 Hyperlipidemia, unspecified: Secondary | ICD-10-CM

## 2013-10-21 DIAGNOSIS — I10 Essential (primary) hypertension: Secondary | ICD-10-CM

## 2013-10-21 DIAGNOSIS — R002 Palpitations: Secondary | ICD-10-CM

## 2013-10-21 DIAGNOSIS — R000 Tachycardia, unspecified: Secondary | ICD-10-CM

## 2013-10-21 DIAGNOSIS — I517 Cardiomegaly: Secondary | ICD-10-CM

## 2013-10-21 NOTE — Progress Notes (Signed)
Patient ID: Deanna SilenceSylvia C [name], female   DOB: 11-27-1955, 58 y.o.   MRN: [id number] E-Cardio 48 hour holter applied to patient.

## 2013-10-22 LAB — BASIC METABOLIC PANEL
BUN: 14 mg/dL (ref 6–23)
CO2: 28 mEq/L (ref 19–32)
Calcium: 8.9 mg/dL (ref 8.4–10.5)
Chloride: 102 mEq/L (ref 96–112)
Creatinine, Ser: 0.8 mg/dL (ref 0.4–1.2)
GFR: 83.11 mL/min (ref >60.00)
Glucose, Bld: 111 mg/dL — ABNORMAL HIGH (ref 70–99)
Potassium: 3.2 mEq/L — ABNORMAL LOW (ref 3.5–5.1)
Sodium: 138 mEq/L (ref 135–145)

## 2013-10-22 LAB — TSH: TSH: 0.79 u[IU]/mL (ref 0.35–4.50)

## 2013-10-29 ENCOUNTER — Telehealth: Payer: Self-pay | Admitting: *Deleted

## 2013-10-29 NOTE — Telephone Encounter (Signed)
Notified pt of her normal holter monitor results per Dr Delton SeeNelson.  Pt verbalized understanding and pleased with this news.

## 2013-10-30 ENCOUNTER — Ambulatory Visit (HOSPITAL_COMMUNITY): Payer: BC Managed Care – PPO | Attending: Internal Medicine

## 2013-10-30 ENCOUNTER — Ambulatory Visit (HOSPITAL_COMMUNITY): Payer: BC Managed Care – PPO | Attending: Internal Medicine | Admitting: Cardiology

## 2013-10-30 DIAGNOSIS — I359 Nonrheumatic aortic valve disorder, unspecified: Secondary | ICD-10-CM | POA: Insufficient documentation

## 2013-10-30 DIAGNOSIS — R0602 Shortness of breath: Secondary | ICD-10-CM | POA: Insufficient documentation

## 2013-10-30 DIAGNOSIS — E785 Hyperlipidemia, unspecified: Secondary | ICD-10-CM | POA: Insufficient documentation

## 2013-10-30 DIAGNOSIS — I10 Essential (primary) hypertension: Secondary | ICD-10-CM | POA: Insufficient documentation

## 2013-10-30 DIAGNOSIS — I517 Cardiomegaly: Secondary | ICD-10-CM | POA: Insufficient documentation

## 2013-10-30 DIAGNOSIS — R0989 Other specified symptoms and signs involving the circulatory and respiratory systems: Secondary | ICD-10-CM | POA: Insufficient documentation

## 2013-10-30 DIAGNOSIS — E119 Type 2 diabetes mellitus without complications: Secondary | ICD-10-CM | POA: Insufficient documentation

## 2013-10-30 DIAGNOSIS — R0609 Other forms of dyspnea: Secondary | ICD-10-CM | POA: Insufficient documentation

## 2013-10-30 DIAGNOSIS — E669 Obesity, unspecified: Secondary | ICD-10-CM | POA: Insufficient documentation

## 2013-10-30 NOTE — Progress Notes (Signed)
Echo performed. 

## 2013-10-30 NOTE — Progress Notes (Signed)
Stress echo performed. 

## 2013-12-21 ENCOUNTER — Encounter: Payer: Self-pay | Admitting: Cardiology

## 2013-12-21 ENCOUNTER — Ambulatory Visit (INDEPENDENT_AMBULATORY_CARE_PROVIDER_SITE_OTHER): Payer: BC Managed Care – PPO | Admitting: Cardiology

## 2013-12-21 VITALS — BP 128/80 | HR 69 | Ht 65.0 in | Wt 247.0 lb

## 2013-12-21 DIAGNOSIS — I517 Cardiomegaly: Secondary | ICD-10-CM | POA: Insufficient documentation

## 2013-12-21 NOTE — Patient Instructions (Signed)
**Note De-identified Deanna [name] Obfuscation** Your physician recommends that you continue on your current medications as directed. Please refer to the Current Medication list given to you today.  Your physician wants you to follow-up in: 1 year. You will receive a reminder letter in the mail two months in advance. If you don't receive a letter, please call our office to schedule the follow-up appointment.  

## 2013-12-21 NOTE — Progress Notes (Signed)
Patient ID: CARISMA TROUPE, female   DOB: 1955/06/03, 58 y.o.   MRN: [id number]    Patient Name: JUANA HARALSON Date of Encounter: 12/21/2013  Primary Care Provider:  Sissy Hoff, MD Primary Cardiologist:  Lars Masson, previously Dr Daleen Squibb  Problem List   Past Medical History  Diagnosis Date  . Thyroid disease   . Hypertension   . Cardiomegaly   . OVERWEIGHT/OBESITY 12/20/2009    Qualifier: Diagnosis of  By: Daleen Squibb, MD, Lorenza Cambridge Palpitations 12/20/2009    Qualifier: Diagnosis of  By: Daleen Squibb, MD, Lorenza Cambridge Shortness of breath 12/20/2009    Qualifier: Diagnosis of  By: Daleen Squibb, MD, Lorenza Cambridge THYROID DISEASE, HX OF 12/20/2009    Qualifier: Diagnosis of  By: Daleen Squibb, MD, Lorenza Cambridge    Past Surgical History  Procedure Laterality Date  . Foot surgery    . Tubal ligation    . Cervical fusion    . Hand surgery    . Cholecystectomy      Allergies  No Known Allergies  HPI  Dr Daleen Squibb: "palpitations...sob...edema/ankles..pt states she has been told that she has a slightly enlarged heart.  History of Present Illness:  Mrs Dionisio is a 58 year old white female referred by Dr. Arelia Sneddon for cardimegaly.  She has had hypertension for 11 years. She has been overweight since her mid-20s after having children. She does have dependent edema at the end of the day the resolves at night. She denies orthopnea or PND. She has thyroid disease pain managed by endocrinology. She is not diabetic.  She does not smoke or drink alcohol. No history of illicit drugs.  She does get short of breath she walked fast even on flat ground. She is compliant with her medications."  The patient is coming after 58 years. She continues having palpitations that can happen several times a week. They are not associated chest pain or shortness of breath but on forced her to cough after which palpitations would discontinue. She's also concerned about dyspnea on exertion after walking about one  block. She mentions that she has significant family history of heart disease with her father dying of congestive heart failure in his early 47s and her mother being diagnosed with hypertrophic cardiomyopathy. She denies any syncope. The patient also complaining of lower extremity edema, it started after she was placed on Naprosyn in April of this year. She stopped taking it and her swelling has resolved. She had her labs checked in April with creatinine 0.7.  12/21/2013 - she is coming after 2 months, occasional palpitations lasting seconds and terminated by cough. No associated dizziness or SOB. No syncope.  Home Medications  Prior to Admission medications   Medication Sig Start Date End Date Taking? Authorizing Provider  levothyroxine (SYNTHROID, LEVOTHROID) 150 MCG tablet Take 150 mcg by mouth daily before breakfast.   Yes Historical Provider, MD  losartan-hydrochlorothiazide (HYZAAR) 100-25 MG per tablet Take 1 tablet by mouth daily.   Yes Historical Provider, MD    Family History  Family History  Problem Relation Age of Onset  . Diabetes    . Hypertension    . Hyperlipidemia    . Congestive Heart Failure Father 59    Social History  History   Social History  . Marital Status: Married    Spouse Name: N/A    Number of Children: N/A  . Years of Education: N/A   Occupational History  .  Not on file.   Social History Main Topics  . Smoking status: Never Smoker   . Smokeless tobacco: Not on file  . Alcohol Use: No  . Drug Use: Not on file  . Sexual Activity: Not on file   Other Topics Concern  . Not on file   Social History Narrative  . No narrative on file     Review of Systems, as per HPI, otherwise negative General:  No chills, fever, night sweats or weight changes.  Cardiovascular:  No chest pain, dyspnea on exertion, edema, orthopnea, palpitations, paroxysmal nocturnal dyspnea. Dermatological: No rash, lesions/masses Respiratory: No cough, dyspnea Urologic: No  hematuria, dysuria Abdominal:   No nausea, vomiting, diarrhea, bright red blood per rectum, melena, or hematemesis Neurologic:  No visual changes, wkns, changes in mental status. All other systems reviewed and are otherwise negative except as noted above.  Physical Exam  Blood pressure 128/80, pulse 69, height  (1.651 m), weight 247 lb (112.038 kg), SpO2 96.00%.  General: Pleasant, NAD Psych: Normal affect. Neuro: Alert and oriented X 3. Moves all extremities spontaneously. HEENT: Normal  Neck: Supple without bruits or JVD. Lungs:  Resp regular and unlabored, CTA. Heart: RRR no s3, s4, or murmurs. Abdomen: Soft, non-tender, non-distended, BS + x 4.  Extremities: No clubbing, cyanosis or edema. DP/PT/Radials 2+ and equal bilaterally.  Labs: None  Accessory Clinical Findings  Echocardiogram - 10/30/2013 Left ventricle: The cavity size was normal. Wall thickness was increased in a pattern of mild LVH. Systolic function was normal. The estimated ejection fraction was in the range of 60% to 65%. Wall motion was normal; there were no regional wall motion abnormalities. Doppler parameters are consistent with abnormal left ventricular relaxation (grade 1 diastolic dysfunction). The E/e&' ratio is <8, suggesting normal LV filling pressure. - Aortic valve: Structurally normal valve. Trileaflet. There was trivial regurgitation. - Left atrium: The atrium was normal in size.  Impressions: - LVEF 60-65%, mild LVH, diastolic dysfunction with normal LV filling pressure, trivial AI and MR.  Stress echo 10/30/2013 - Stress ECG conclusions: There were no stress arrhythmias or conduction abnormalities. The stress ECG was negative for ischemia. - Staged echo: There was no echocardiographic evidence for stress-induced ischemia. - Impressions: Normal stress echo with no RWMAls and augmentation of EF with stress 1mm upsloping ST changes in recovery not thought to be  signficant.  Impressions: - Normal stress echo with no RWMAls and augmentation of EF with stress 1mm upsloping ST changes in recovery not thought to be signficant.  ECG - normal sinus rhythm, 64 beats per minute, normal EKG.    Assessment & Plan  58 year old female  1. LVH - on antihypertensives since 2007, history of hypertrophic cardiomyopathy in her mother and inconclusive echocardiogram in 2011. Repeat echocardiogram shows mild concentric LVH. Since she is 43, its unlikely that she will develop HCM. Her LVH is most probably result of hypertension and obesity. We will follow.   2. Hypertension - well controlled on current medication  3. Hyperlipidemia - LDL 113, HDL 46, triglycerides 110. Wall is less than 100 considering she does not history of coronary artery disease we'll for now treat medically.  4. Dyspnea on exertion - negative stress echocardiogram, no stress arrhythmias or conduction abnormalities. The stress ECG was negative for ischemia.  5. Palpitations - only few PACs and PVCs on 48 hour Holter, however she states that she didn't experience palpitations during the monitoring. Her description of palpitations is however reassuring of benign arrhythmia.  6. lower back pain - improved with Naprosyn that significant lower extremity edema, we will discontinue Naprosyn and start tramadol 25 mg twice a day when necessary. We will check kidney function today.  7. Obesity - encouraged to exercise.  Followup in 1 year.   Lars Masson, MD, [facility] 12/21/2013, 3:39 PM

## 2013-12-29 ENCOUNTER — Encounter: Payer: Self-pay | Admitting: *Deleted

## 2014-05-12 ENCOUNTER — Encounter: Payer: Self-pay | Admitting: Podiatry

## 2014-05-12 ENCOUNTER — Ambulatory Visit (INDEPENDENT_AMBULATORY_CARE_PROVIDER_SITE_OTHER): Payer: BC Managed Care – PPO | Admitting: Podiatry

## 2014-05-12 ENCOUNTER — Ambulatory Visit: Payer: BC Managed Care – PPO

## 2014-05-12 ENCOUNTER — Ambulatory Visit (INDEPENDENT_AMBULATORY_CARE_PROVIDER_SITE_OTHER): Payer: BC Managed Care – PPO

## 2014-05-12 VITALS — BP 155/78 | HR 77 | Resp 16

## 2014-05-12 DIAGNOSIS — M722 Plantar fascial fibromatosis: Secondary | ICD-10-CM

## 2014-05-12 DIAGNOSIS — M7662 Achilles tendinitis, left leg: Secondary | ICD-10-CM

## 2014-05-12 MED ORDER — TRIAMCINOLONE ACETONIDE 10 MG/ML IJ SUSP
10.0000 mg | Freq: Once | INTRAMUSCULAR | Status: AC
  Administered 2014-05-12: 10 mg

## 2014-05-12 NOTE — Progress Notes (Signed)
   Subjective:    Patient ID: Deanna [name], female    DOB: Feb 27, 1956, 59 y.o.   MRN: [id number]  HPI  Pt presents with left heel pain, right foot pain when walking on uneven surfaces  Review of Systems  All other systems reviewed and are negative.      Objective:   Physical Exam        Assessment & Plan:

## 2014-05-12 NOTE — Patient Instructions (Signed)

## 2014-05-13 NOTE — Progress Notes (Signed)
Subjective:     Patient ID: Deanna [name], female   DOB: 06/21/1955, 59 y.o.   MRN: [id number]  HPI patient presents I'm getting a lot of pain in the bottom my left heel for about 4 months and also some discomfort in my right lateral foot that just occurs occasionally and feel likes the foot is going to get out   Review of Systems     Objective:   Physical Exam Neurovascular status intact with muscle strength adequate range of motion within normal limits with significant discomfort plantar aspect of the left heel at the insertion of tendon the calcaneus and on the right foot I was unable to elicit pain in the lateral side of the foot    Assessment:     Plantar fasciitis left with inflammation and lateral foot pain right that is probably related to bone structure    Plan:     H&P and x-rays reviewed and injected the left plantar fascia 3 Milligan Kenalog 5 mg Xylocaine and went ahead and dispensed fascial brace with instructions. Reviewed x-rays of the right but did not note any significant pathology

## 2015-03-16 ENCOUNTER — Ambulatory Visit: Payer: BC Managed Care – PPO | Admitting: Podiatry

## 2015-11-17 ENCOUNTER — Encounter: Payer: Self-pay | Admitting: Podiatry

## 2015-11-17 ENCOUNTER — Ambulatory Visit (INDEPENDENT_AMBULATORY_CARE_PROVIDER_SITE_OTHER): Payer: BLUE CROSS/BLUE SHIELD | Admitting: Podiatry

## 2015-11-17 ENCOUNTER — Ambulatory Visit (INDEPENDENT_AMBULATORY_CARE_PROVIDER_SITE_OTHER): Payer: BC Managed Care – PPO

## 2015-11-17 VITALS — BP 113/72 | HR 70 | Resp 16

## 2015-11-17 DIAGNOSIS — M779 Enthesopathy, unspecified: Secondary | ICD-10-CM

## 2015-11-17 DIAGNOSIS — M79671 Pain in right foot: Secondary | ICD-10-CM | POA: Diagnosis not present

## 2015-11-17 DIAGNOSIS — M79672 Pain in left foot: Secondary | ICD-10-CM | POA: Diagnosis not present

## 2015-11-17 DIAGNOSIS — M722 Plantar fascial fibromatosis: Secondary | ICD-10-CM | POA: Diagnosis not present

## 2015-11-17 MED ORDER — TRIAMCINOLONE ACETONIDE 10 MG/ML IJ SUSP
10.0000 mg | Freq: Once | INTRAMUSCULAR | Status: AC
  Administered 2015-11-17: 10 mg

## 2015-11-18 NOTE — Progress Notes (Signed)
Subjective:     Patient ID: Deanna [name], female   DOB: 1956/03/12, 60 y.o.   MRN: [id number]  HPI patient presents stating she has pain in the mid arch area left and around the big toe right where we had done previous surgery. Does not remember specific injury but it is painful with palpation   Review of Systems     Objective:   Physical Exam Neurovascular status intact with patient noted to have mid arch inflammation fluid buildup left foot and also on the right medial side of the first MPJ there is inflammation in the more plantar aspect of the surface with no indications of traumatic pathology and good range of motion with no crepitus    Assessment:     Mid arch plantar fasciitis left along with inflammation of the medial side right first metatarsal with fluid buildup and pain    Plan:     H&P and x-rays reviewed and today I did a careful injection of the medial side first MPJ plantar 3 mg Kenalog 5 mg Xylocaine and injected the mid arch area left 3 mg Kenalog 5 mill grams Xylocaine and advised on stretching exercises physical therapy. Patient will be seen back to review  X-ray report indicates no indications of pathology with pins in place right first metatarsal joint congruence and no indications of spur formation

## 2016-08-27 ENCOUNTER — Ambulatory Visit (INDEPENDENT_AMBULATORY_CARE_PROVIDER_SITE_OTHER): Payer: BLUE CROSS/BLUE SHIELD | Admitting: Physician Assistant

## 2016-08-27 ENCOUNTER — Encounter: Payer: Self-pay | Admitting: Physician Assistant

## 2016-08-27 VITALS — BP 128/84 | HR 75 | Ht 65.25 in | Wt 256.4 lb

## 2016-08-27 DIAGNOSIS — I517 Cardiomegaly: Secondary | ICD-10-CM

## 2016-08-27 DIAGNOSIS — Z8249 Family history of ischemic heart disease and other diseases of the circulatory system: Secondary | ICD-10-CM | POA: Diagnosis not present

## 2016-08-27 DIAGNOSIS — I10 Essential (primary) hypertension: Secondary | ICD-10-CM

## 2016-08-27 DIAGNOSIS — E785 Hyperlipidemia, unspecified: Secondary | ICD-10-CM

## 2016-08-27 DIAGNOSIS — E663 Overweight: Secondary | ICD-10-CM

## 2016-08-27 NOTE — Progress Notes (Signed)
Cardiology Office Note    Date:  08/27/2016   ID:  Deanna [name], Deanna [name] 09/17/1955, MRN [id number]  PCP:  [name], MD  Cardiologist: Dr. Delton See  No chief complaint on file.  Deanna [name] is a 61 y.o. female who is being seen today for the evaluation of HTN at the request of Dr. Arelia Sneddon  History of Present Illness:  Deanna [name] is a 61 y.o. female with history of cardiomegaly, HTN, dependent edema, thyroid disease, father died CHF 49's, mother hypertrophic cardiomyopathy. Last saw Dr. Delton See 11/2013 normal stress echo with stress 1mm upsloping ST changes in recovery not thought to be significant. Echo LVEF 60-65% mild LVH, diastolic dysfunction. Palpitations with few PAC's and PVC's on 48 hr holter.  Patient comes in today after a 3 yr hiatus. She continues to have occasional palpitations that last only seconds and cause her to cough which stops the palpitations. She has no associated dizziness or presyncope, shortness of breath or chest pain. Happens once or twice a week. She has chronic dyspnea on exertion that is unchanged since her last office visit. She just got a stair stepper and is going to try to get back in shape. She denies chest pain, dyspnea at rest, dizziness or presyncope. She does have some lower extremity edema when she sits at her desk all day at work but it goes away on the weekends. Blood pressures been well-controlled.    Past Medical History:  Diagnosis Date  . Cardiomegaly   . Hypertension   . OVERWEIGHT/OBESITY 12/20/2009   Qualifier: Diagnosis of  By: Daleen Squibb, MD, Lorenza Cambridge Palpitations 12/20/2009   Qualifier: Diagnosis of  By: Daleen Squibb, MD, Lorenza Cambridge Shortness of breath 12/20/2009   Qualifier: Diagnosis of  By: Daleen Squibb, MD, Lorenza Cambridge Thyroid disease   . THYROID DISEASE, HX OF 12/20/2009   Qualifier: Diagnosis of  By: Daleen Squibb, MD, Lorenza Cambridge     Past Surgical History:  Procedure Laterality Date  . CERVICAL FUSION    .  CHOLECYSTECTOMY    . FOOT SURGERY    . HAND SURGERY    . TUBAL LIGATION      Current Medications: Outpatient Medications Prior to Visit  Medication Sig Dispense Refill  . levothyroxine (SYNTHROID, LEVOTHROID) 150 MCG tablet Take 150 mcg by mouth daily before breakfast.    . losartan-hydrochlorothiazide (HYZAAR) 100-25 MG per tablet Take 1 tablet by mouth daily.     No facility-administered medications prior to visit.      Allergies:   Patient has no known allergies.   Social History   Social History  . Marital status: Married    Spouse name: N/A  . Number of children: N/A  . Years of education: N/A   Social History Main Topics  . Smoking status: Never Smoker  . Smokeless tobacco: Never Used  . Alcohol use No  . Drug use: Unknown  . Sexual activity: Not on file   Other Topics Concern  . Not on file   Social History Narrative  . No narrative on file     Family History:  The patient's family history includes Congestive Heart Failure (age of onset: 57) in her father.   ROS:   Please see the history of present illness.    Review of Systems  Constitution: Negative.  HENT: Negative.   Eyes: Negative.   Cardiovascular: Positive for dyspnea on exertion, leg swelling and palpitations.  Respiratory: Negative.   Hematologic/Lymphatic: Negative.   Musculoskeletal: Negative.  Negative for joint pain.  Gastrointestinal: Negative.   Genitourinary: Negative.   Neurological: Negative.    All other systems reviewed and are negative.   PHYSICAL EXAM:   VS:  BP 128/84 (BP Location: Left Arm, Patient Position: Sitting, Cuff Size: Large)   Pulse 75   Ht 5' 5.25" (1.657 m)   Wt 256 lb 6.4 oz (116.3 kg)   BMI 42.34 kg/m   Physical Exam  GEN: Obese, in no acute distress  Neck: no JVD, carotid bruits, or masses Cardiac:RRR; 2/6 systolic murmur at the left sternal border Respiratory:  clear to auscultation bilaterally, normal work of breathing GI: soft, nontender,  nondistended, + BS Ext: without cyanosis, clubbing, or edema, Good distal pulses bilaterally Neuro:  Alert and Oriented x 3 Psych: euthymic mood, full affect  Wt Readings from Last 3 Encounters:  08/27/16 256 lb 6.4 oz (116.3 kg)  12/21/13 247 lb (112 kg)  10/19/13 248 lb (112.5 kg)      Studies/Labs Reviewed:   EKG:  EKG is  ordered today.  The ekg ordered today demonstrates Normal sinus rhythm with LVH, no acute change from prior EKG 3 years ago  Recent Labs: No results found for requested labs within last 8760 hours.   Lipid Panel No results found for: CHOL, TRIG, HDL, CHOLHDL, VLDL, LDLCALC, LDLDIRECT  Additional studies/ records that were reviewed today include:    Echocardiogram - 10/30/2013 Left ventricle: The cavity size was normal. Wall thickness was increased in a pattern of mild LVH. Systolic function was normal. The estimated ejection fraction was in the range of 60% to 65%. Wall motion was normal; there were no regional wall motion abnormalities. Doppler parameters are consistent with abnormal left ventricular relaxation (grade 1 diastolic dysfunction). The E/e&' ratio is <8, suggesting normal LV filling pressure. - Aortic valve: Structurally normal valve. Trileaflet. There was trivial regurgitation. - Left atrium: The atrium was normal in size.  Impressions: - LVEF 60-65%, mild LVH, diastolic dysfunction with normal LV filling pressure, trivial AI and MR.   Stress echo 10/30/2013 - Stress ECG conclusions: There were no stress arrhythmias or conduction abnormalities. The stress ECG was negative for ischemia. - Staged echo: There was no echocardiographic evidence for stress-induced ischemia. - Impressions: Normal stress echo with no RWMAls and augmentation of EF with stress 1mm upsloping ST changes in recovery not thought to be signficant.  Impressions: - Normal stress echo with no RWMAls and augmentation of EF with stress 1mm upsloping ST changes in  recovery not thought to be signficant.     ASSESSMENT:    1. Essential hypertension   2. LVH (left ventricular hypertrophy)   3. Family history of hypertrophic cardiomyopathy   4. Hyperlipidemia, unspecified hyperlipidemia type   5. Overweight      PLAN:  In order of problems listed above:  Essential hypertension with LVH well controlled on Hyzaar. Recommend follow-up 2-D echo for LVH. 2 g sodium diet recommended. Follow-up with Dr. Delton SeeNelson in 1 year.  Family history of hypertrophic cardiomyopathy check and 2-D echo  Hyperlipidemia patient's lipids are followed by primary care and were drawn recently. Patient will send us a copy of this. She says she has had borderline lipids.  Overweight/obesity exercise and weight loss program discussed with patient. Highly recommend Weight Watchers which the patient will consider.    Medication Adjustments/Labs and Tests Ordered: Current medicines are reviewed at length with the patient today.  Concerns  regarding medicines are outlined above.  Medication changes, Labs and Tests ordered today are listed in the Patient Instructions below. There are no Patient Instructions on file for this visit.   Elson Clan, PA-C  08/27/2016 8:32 AM    [facility] [address] [place], [place], [place]  [address] Phone: [phone]; Fax: [phone]

## 2016-08-27 NOTE — Patient Instructions (Signed)
Medication Instructions:  Your physician recommends that you continue on your current medications as directed. Please refer to the Current Medication list given to you today.   Labwork: -None  Testing/Procedures: Your physician has requested that you have an echocardiogram. Echocardiography is a painless test that uses sound waves to create images of your heart. It provides your doctor with information about the size and shape of your heart and how well your heart's chambers and valves are working. This procedure takes approximately one hour. There are no restrictions for this procedure.    Follow-Up: Your physician wants you to follow-up in: 1 year with Dr. Delton SeeNelson.  You will receive a reminder letter in the mail two months in advance. If you don't receive a letter, please call our office to schedule the follow-up appointment.   Any Other Special Instructions Will Be Listed Below (If Applicable).   Low-Sodium Eating Plan Sodium, which is an element that makes up salt, helps you maintain a healthy balance of fluids in your body. Too much sodium can increase your blood pressure and cause fluid and waste to be held in your body. Your health care provider or dietitian may recommend following this plan if you have high blood pressure (hypertension), kidney disease, liver disease, or heart failure. Eating less sodium can help lower your blood pressure, reduce swelling, and protect your heart, liver, and kidneys. What are tips for following this plan? General guidelines  Most people on this plan should limit their sodium intake to 1,500-2,000 mg (milligrams) of sodium each day. Reading food labels  The Nutrition Facts label lists the amount of sodium in one serving of the food. If you eat more than one serving, you must multiply the listed amount of sodium by the number of servings.  Choose foods with less than 140 mg of sodium per serving.  Avoid foods with 300 mg of sodium or more per  serving. Shopping  Look for lower-sodium products, often labeled as "low-sodium" or "no salt added."  Always check the sodium content even if foods are labeled as "unsalted" or "no salt added".  Buy fresh foods. ? Avoid canned foods and premade or frozen meals. ? Avoid canned, cured, or processed meats  Buy breads that have less than 80 mg of sodium per slice. Cooking  Eat more home-cooked food and less restaurant, buffet, and fast food.  Avoid adding salt when cooking. Use salt-free seasonings or herbs instead of table salt or sea salt. Check with your health care provider or pharmacist before using salt substitutes.  Cook with plant-based oils, such as canola, sunflower, or olive oil. Meal planning  When eating at a restaurant, ask that your food be prepared with less salt or no salt, if possible.  Avoid foods that contain MSG (monosodium glutamate). MSG is sometimes added to Cong[place]inese food, bouillon, and some canned foods. What foods are recommended? The items listed may not be a complete list. Talk with your dietitian about what dietary choices are best for you. Grains Low-sodium cereals, including oats, puffed wheat and rice, and shredded wheat. Low-sodium crackers. Unsalted rice. Unsalted pasta. Low-sodium bread. Whole-grain breads and whole-grain pasta. Vegetables Fresh or frozen vegetables. "No salt added" canned vegetables. "No salt added" tomato sauce and paste. Low-sodium or reduced-sodium tomato and vegetable juice. Fruits Fresh, frozen, or canned fruit. Fruit juice. Meats and other protein foods Fresh or frozen (no salt added) meat, poultry, seafood, and fish. Low-sodium canned tuna and salmon. Unsalted nuts. Dried peas, beans, and lentils  without added salt. Unsalted canned beans. Eggs. Unsalted nut butters. Dairy Milk. Soy milk. Cheese that is naturally low in sodium, such as ricotta cheese, fresh mozzarella, or Swiss cheese Low-sodium or reduced-sodium cheese. Cream  cheese. Yogurt. Fats and oils Unsalted butter. Unsalted margarine with no trans fat. Vegetable oils such as canola or olive oils. Seasonings and other foods Fresh and dried herbs and spices. Salt-free seasonings. Low-sodium mustard and ketchup. Sodium-free salad dressing. Sodium-free light mayonnaise. Fresh or refrigerated horseradish. Lemon juice. Vinegar. Homemade, reduced-sodium, or low-sodium soups. Unsalted popcorn and pretzels. Low-salt or salt-free chips. What foods are not recommended? The items listed may not be a complete list. Talk with your dietitian about what dietary choices are best for you. Grains Instant hot cereals. Bread stuffing, pancake, and biscuit mixes. Croutons. Seasoned rice or pasta mixes. Noodle soup cups. Boxed or frozen macaroni and cheese. Regular salted crackers. Self-rising flour. Vegetables Sauerkraut, pickled vegetables, and relishes. Olives. [place] fries. Onion rings. Regular canned vegetables (not low-sodium or reduced-sodium). Regular canned tomato sauce and paste (not low-sodium or reduced-sodium). Regular tomato and vegetable juice (not low-sodium or reduced-sodium). Frozen vegetables in sauces. Meats and other protein foods Meat or fish that is salted, canned, smoked, spiced, or pickled. Bacon, ham, sausage, hotdogs, corned beef, chipped beef, packaged lunch meats, salt pork, jerky, pickled herring, anchovies, regular canned tuna, sardines, salted nuts. Dairy Processed cheese and cheese spreads. Cheese curds. Blue cheese. Feta cheese. String cheese. Regular cottage cheese. Buttermilk. Canned milk. Fats and oils Salted butter. Regular margarine. Ghee. Bacon fat. Seasonings and other foods Onion salt, garlic salt, seasoned salt, table salt, and sea salt. Canned and packaged gravies. Worcestershire sauce. Tartar sauce. Barbecue sauce. Teriyaki sauce. Soy sauce, including reduced-sodium. Steak sauce. Fish sauce. Oyster sauce. Cocktail sauce. Horseradish that you  find on the shelf. Regular ketchup and mustard. Meat flavorings and tenderizers. Bouillon cubes. Hot sauce and Tabasco sauce. Premade or packaged marinades. Premade or packaged taco seasonings. Relishes. Regular salad dressings. Salsa. Potato and tortilla chips. Corn chips and puffs. Salted popcorn and pretzels. Canned or dried soups. Pizza. Frozen entrees and pot pies. Summary  Eating less sodium can help lower your blood pressure, reduce swelling, and protect your heart, liver, and kidneys.  Most people on this plan should limit their sodium intake to 1,500-2,000 mg (milligrams) of sodium each day.  Canned, boxed, and frozen foods are high in sodium. Restaurant foods, fast foods, and pizza are also very high in sodium. You also get sodium by adding salt to food.  Try to cook at home, eat more fresh fruits and vegetables, and eat less fast food, canned, processed, or prepared foods. This information is not intended to replace advice given to you by your health care provider. Make sure you discuss any questions you have with your health care provider. Document Released: 09/01/2001 Document Revised: 03/05/2016 Document Reviewed: 03/05/2016 Elsevier Interactive Patient Education  2017 [employer].    If you need a refill on your cardiac medications before your next appointment, please call your pharmacy.

## 2016-08-28 ENCOUNTER — Other Ambulatory Visit: Payer: Self-pay

## 2016-08-28 ENCOUNTER — Ambulatory Visit (HOSPITAL_COMMUNITY): Payer: BLUE CROSS/BLUE SHIELD | Attending: Cardiovascular Disease

## 2016-08-28 DIAGNOSIS — I517 Cardiomegaly: Secondary | ICD-10-CM

## 2016-08-28 DIAGNOSIS — I10 Essential (primary) hypertension: Secondary | ICD-10-CM

## 2016-09-06 ENCOUNTER — Telehealth: Payer: Self-pay

## 2016-09-06 NOTE — Telephone Encounter (Signed)
SENT NOTES TO SCHEDULING 

## 2018-03-31 ENCOUNTER — Other Ambulatory Visit: Payer: Self-pay | Admitting: Obstetrics and Gynecology

## 2018-03-31 DIAGNOSIS — R928 Other abnormal and inconclusive findings on diagnostic imaging of breast: Secondary | ICD-10-CM

## 2018-04-03 ENCOUNTER — Ambulatory Visit
Admission: RE | Admit: 2018-04-03 | Discharge: 2018-04-03 | Disposition: A | Discharge status: Home / Self Care | Payer: BLUE CROSS/BLUE SHIELD | Source: Ambulatory Visit | Attending: Obstetrics and Gynecology | Admitting: Obstetrics and Gynecology

## 2018-04-03 DIAGNOSIS — R928 Other abnormal and inconclusive findings on diagnostic imaging of breast: Secondary | ICD-10-CM

## 2019-04-06 ENCOUNTER — Ambulatory Visit: Payer: BC Managed Care – PPO | Attending: Internal Medicine

## 2019-04-06 DIAGNOSIS — Z20822 Contact with and (suspected) exposure to covid-19: Secondary | ICD-10-CM

## 2019-04-08 LAB — NOVEL CORONAVIRUS, NAA: SARS-CoV-2, NAA: NOT DETECTED

## 2019-11-21 ENCOUNTER — Other Ambulatory Visit: Payer: Self-pay | Admitting: Adult Health

## 2019-11-21 NOTE — Progress Notes (Signed)
I connected by phone with Deanna [name] on 11/21/2019 at 9:39 AM to discuss the potential use of a new treatment for mild to moderate COVID-19 viral infection in non-hospitalized patients.  This patient is a 64 y.o. female that meets the FDA criteria for Emergency Use Authorization of COVID monoclonal antibody casirivimab/imdevimab.  Has a (+) direct SARS-CoV-2 viral test result  Has mild or moderate COVID-19   Is NOT hospitalized due to COVID-19  Is within 10 days of symptom onset  Has at least one of the high risk factor(s) for progression to severe COVID-19 and/or hospitalization as defined in EUA.  Specific high risk criteria : Cardiovascular disease or hypertension   I have spoken and communicated the following to the patient or parent/caregiver regarding COVID monoclonal antibody treatment:  1. FDA has authorized the emergency use for the treatment of mild to moderate COVID-19 in adults and pediatric patients with positive results of direct SARS-CoV-2 viral testing who are 69 years of age and older weighing at least 40 kg, and who are at high risk for progressing to severe COVID-19 and/or hospitalization.  2. The significant known and potential risks and benefits of COVID monoclonal antibody, and the extent to which such potential risks and benefits are unknown.  3. Information on available alternative treatments and the risks and benefits of those alternatives, including clinical trials.  4. Patients treated with COVID monoclonal antibody should continue to self-isolate and use infection control measures (e.g., wear mask, isolate, social distance, avoid sharing personal items, clean and disinfect "high touch" surfaces, and frequent handwashing) according to CDC guidelines.   5. The patient or parent/caregiver has the option to accept or refuse COVID monoclonal antibody treatment.  After reviewing this information with the patient, The patient agreed to proceed with receiving  casirivimab\imdevimab infusion and will be provided a copy of the Fact sheet prior to receiving the infusion.  Set up for 11/22/19 at 1600 .   [name] 11/21/2019 9:39 AM

## 2019-11-22 ENCOUNTER — Ambulatory Visit (HOSPITAL_COMMUNITY)
Admission: RE | Admit: 2019-11-22 | Discharge: 2019-11-22 | Disposition: A | Discharge status: Home / Self Care | Payer: BC Managed Care – PPO | Source: Ambulatory Visit | Attending: Pulmonary Disease | Admitting: Pulmonary Disease

## 2019-11-22 DIAGNOSIS — U071 COVID-19: Secondary | ICD-10-CM | POA: Diagnosis not present

## 2019-11-22 MED ORDER — FAMOTIDINE IN NACL 20-0.9 MG/50ML-% IV SOLN: 20.0000 mg | Freq: Once | INTRAVENOUS | Status: DC | PRN

## 2019-11-22 MED ORDER — ALBUTEROL SULFATE HFA 108 (90 BASE) MCG/ACT IN AERS: 2.0000 | INHALATION_SPRAY | Freq: Once | RESPIRATORY_TRACT | Status: DC | PRN

## 2019-11-22 MED ORDER — DIPHENHYDRAMINE HCL 50 MG/ML IJ SOLN: 50.0000 mg | Freq: Once | INTRAMUSCULAR | Status: DC | PRN

## 2019-11-22 MED ORDER — SODIUM CHLORIDE 0.9 % IV SOLN
1200.0000 mg | Freq: Once | INTRAVENOUS | Status: AC
  Administered 2019-11-22: 1200 mg via INTRAVENOUS
  Filled 2019-11-22: qty 10

## 2019-11-22 MED ORDER — EPINEPHRINE 0.3 MG/0.3ML IJ SOAJ: 0.3000 mg | Freq: Once | INTRAMUSCULAR | Status: DC | PRN

## 2019-11-22 MED ORDER — METHYLPREDNISOLONE SODIUM SUCC 125 MG IJ SOLR: 125.0000 mg | Freq: Once | INTRAMUSCULAR | Status: DC | PRN

## 2019-11-22 MED ORDER — SODIUM CHLORIDE 0.9 % IV SOLN: INTRAVENOUS | Status: DC | PRN

## 2019-11-22 NOTE — Progress Notes (Signed)
  Diagnosis: COVID-19  Physician: Dr. Wright  Procedure: [facility] Med: casirivimab\imdevimab infusion - Provided patient with casirivimab\imdevimab fact sheet for patients, parents and caregivers prior to infusion.  Complications: No immediate complications noted.  Discharge: Discharged home   Boston Catarino [facility] 11/22/2019  

## 2019-11-22 NOTE — Discharge Instructions (Signed)

## 2019-12-10 ENCOUNTER — Other Ambulatory Visit: Payer: Self-pay | Admitting: Neurosurgery

## 2019-12-10 DIAGNOSIS — Z1231 Encounter for screening mammogram for malignant neoplasm of breast: Secondary | ICD-10-CM

## 2019-12-24 ENCOUNTER — Other Ambulatory Visit: Payer: Self-pay

## 2019-12-24 ENCOUNTER — Ambulatory Visit
Admission: RE | Admit: 2019-12-24 | Discharge: 2019-12-24 | Disposition: A | Discharge status: Home / Self Care | Payer: BC Managed Care – PPO | Source: Ambulatory Visit | Attending: Obstetrics & Gynecology | Admitting: Obstetrics & Gynecology

## 2019-12-24 DIAGNOSIS — Z1231 Encounter for screening mammogram for malignant neoplasm of breast: Secondary | ICD-10-CM

## 2020-01-31 ENCOUNTER — Ambulatory Visit
Admission: RE | Admit: 2020-01-31 | Discharge: 2020-01-31 | Disposition: A | Discharge status: Home / Self Care | Payer: BC Managed Care – PPO | Source: Ambulatory Visit | Attending: Physician Assistant | Admitting: Physician Assistant

## 2020-01-31 ENCOUNTER — Other Ambulatory Visit: Payer: Self-pay

## 2020-01-31 VITALS — BP 142/84 | HR 84 | Temp 98.1°F | Resp 17

## 2020-01-31 DIAGNOSIS — M25562 Pain in left knee: Secondary | ICD-10-CM | POA: Diagnosis not present

## 2020-01-31 MED ORDER — MELOXICAM 7.5 MG PO TABS: 7.5000 mg | ORAL_TABLET | Freq: Every day | ORAL | 0 refills | Status: DC

## 2020-01-31 NOTE — ED Triage Notes (Signed)
Patient presents to Urgent Care with complaints of left leg pain since Thursday night. Pt reports pain located at the calf area and radiates to ankle. She reports seeing ortho specialist for in the past from a previous knee injury. Pt concerned if new pain is from the knee injury. Rates pain a 10/10 and has been taken tylenol for relief.

## 2020-01-31 NOTE — ED Provider Notes (Signed)
[facility]    CSN: [id number] Arrival date & time: 01/31/20  0846      History   Chief Complaint Chief Complaint  Patient presents with  . Appointment    0900  . Leg Pain    HPI Deanna [name] is a 64 y.o. female.   64 year old female comes in for left knee pain. This first started early October after tripping over her dog and falling. She landed on her right knee, but though may have twisted left knee. Since then, she has had pain with certain movement. However, 4 days ago, developed more consistent pain without new injury. Has tightness to the knee with some swelling. Denies erythema, warmth. Has anterior knee numbness since injury 2019 at baseline. Had polyp removal 12/24/2019 that was an in office procedure. No immobility, hormone use, h/o blood clots. Denies chest pain, shob. Ibuprofen 600mg  QD-BID without relief.     Past Medical History:  Diagnosis Date  . Cardiomegaly   . Hypertension   . OVERWEIGHT/OBESITY 12/20/2009   Qualifier: Diagnosis of  By: 12/22/2009, MD, Daleen Squibb Palpitations 12/20/2009   Qualifier: Diagnosis of  By: 12/22/2009, MD, Daleen Squibb Shortness of breath 12/20/2009   Qualifier: Diagnosis of  By: 12/22/2009, MD, Daleen Squibb Thyroid disease   . THYROID DISEASE, HX OF 12/20/2009   Qualifier: Diagnosis of  By: 12/22/2009, MD, Daleen Squibb     Patient Active Problem List   Diagnosis Date Noted  . LVH (left ventricular hypertrophy) 12/21/2013  . Hyperlipidemia 10/19/2013  . Family history of hypertrophic cardiomyopathy 10/19/2013  . Dyspnea on exertion 10/19/2013  . Overweight 12/20/2009  . Essential hypertension 12/20/2009  . CARDIOMEGALY 12/20/2009  . PALPITATIONS 12/20/2009  . SHORTNESS OF BREATH 12/20/2009  . THYROID DISEASE, HX OF 12/20/2009    Past Surgical History:  Procedure Laterality Date  . CERVICAL FUSION    . CHOLECYSTECTOMY    . FOOT SURGERY    . HAND SURGERY    . TUBAL LIGATION      OB History   No  obstetric history on file.      Home Medications    Prior to Admission medications   Medication Sig Start Date End Date Taking? Authorizing Provider  levothyroxine (SYNTHROID, LEVOTHROID) 150 MCG tablet Take 150 mcg by mouth daily before breakfast.    [provider]  losartan-hydrochlorothiazide (HYZAAR) 100-25 MG per tablet Take 1 tablet by mouth daily.    [provider]  meloxicam (MOBIC) 7.5 MG tablet Take 1-2 tablets (7.5-15 mg total) by mouth daily. 01/31/20   13/7/21, PA-C    Family History Family History  Problem Relation Age of Onset  . Congestive Heart Failure Father 3  . Diabetes Other   . Hypertension Other   . Hyperlipidemia Other   . Asthma Other   . Kidney disease Other   . Diverticulosis Other     Social History Social History   Tobacco Use  . Smoking status: Never Smoker  . Smokeless tobacco: Never Used  Substance Use Topics  . Alcohol use: No  . Drug use: Never     Allergies   Patient has no known allergies.   Review of Systems Review of Systems  Reason unable to perform ROS: See HPI as above.     Physical Exam Triage Vital Signs ED Triage Vitals  Enc Vitals Group     BP 01/31/20 0904 (  S) (!) 142/84     Pulse Rate 01/31/20 0904 84     Resp 01/31/20 0904 17     Temp 01/31/20 0904 98.1 F (36.7 C)     Temp src --      SpO2 01/31/20 0904 95 %     Weight --      Height --      Head Circumference --      Peak Flow --      Pain Score 01/31/20 0858 10     Pain Loc --      Pain Edu? --      Excl. in GC? --    No data found.  Updated Vital Signs BP (S) (!) 142/84 (BP Location: Left Arm) Comment: reports baseline  Pulse 84   Temp 98.1 F (36.7 C)   Resp 17   SpO2 95%   Physical Exam Constitutional:      General: She is not in acute distress.    Appearance: Normal appearance. She is well-developed. She is not toxic-appearing or diaphoretic.  HENT:     Head: Normocephalic and atraumatic.  Eyes:      Conjunctiva/sclera: Conjunctivae normal.     Pupils: Pupils are equal, round, and reactive to light.  Pulmonary:     Effort: Pulmonary effort is normal. No respiratory distress.  Musculoskeletal:     Cervical back: Normal range of motion and neck supple.     Comments: No obvious swelling, erythema, warmth. Tenderness to palpation along patellar tendon. No tenderness to palpation of posterior knee. No tenderness along joint space, though patient feels pain mostly to the bilateral posterior joint line. Full ROM of knee. Strength/sensation intact distally.  Lower leg measurements similar, <1cm in difference  Skin:    General: Skin is warm and dry.  Neurological:     Mental Status: She is alert and oriented to person, place, and time.      UC Treatments / Results  Labs (all labs ordered are listed, but only abnormal results are displayed) Labs Reviewed - No data to display  EKG   Radiology No results found.  Procedures Procedures (including critical care time)  Medications Ordered in UC Medications - No data to display  Initial Impression / Assessment and Plan / UC Course  I have reviewed the triage vital signs and the nursing notes.  Pertinent labs & imaging results that were available during my care of the patient were reviewed by me and considered in my medical decision making (see chart for details).    Low suspicion for DVT at this time. NSAIDs, ice compress, knee sleeve during activity. Ortho follow up if symptoms not improving. Return precautions given.  Final Clinical Impressions(s) / UC Diagnoses   Final diagnoses:  Acute pain of left knee    ED Prescriptions    Medication Sig Dispense Auth. Provider   meloxicam (MOBIC) 7.5 MG tablet Take 1-2 tablets (7.5-15 mg total) by mouth daily. 20 tablet Belinda Fisher, PA-C     PDMP not reviewed this encounter.   Belinda Fisher, PA-C 01/31/20 [phone]

## 2020-01-31 NOTE — Discharge Instructions (Signed)
Start Mobic. Do not take ibuprofen (motrin/advil)/ naproxen (aleve) while on mobic. Ice compress, rest, knee sleeve during activity. Follow up with orthopedics if symptoms not improving.

## 2020-08-04 IMAGING — MG DIGITAL DIAGNOSTIC UNILATERAL RIGHT MAMMOGRAM WITH TOMO AND CAD
4 series · 4 of 12 positions shown · non-contrast
Comparison: Previous exam(s).

CLINICAL DATA: Screening recall for possible right breast mass.

EXAM:
DIGITAL DIAGNOSTIC UNILATERAL RIGHT MAMMOGRAM WITH CAD AND TOMO
RIGHT BREAST ULTRASOUND

[R CC synth-2D]
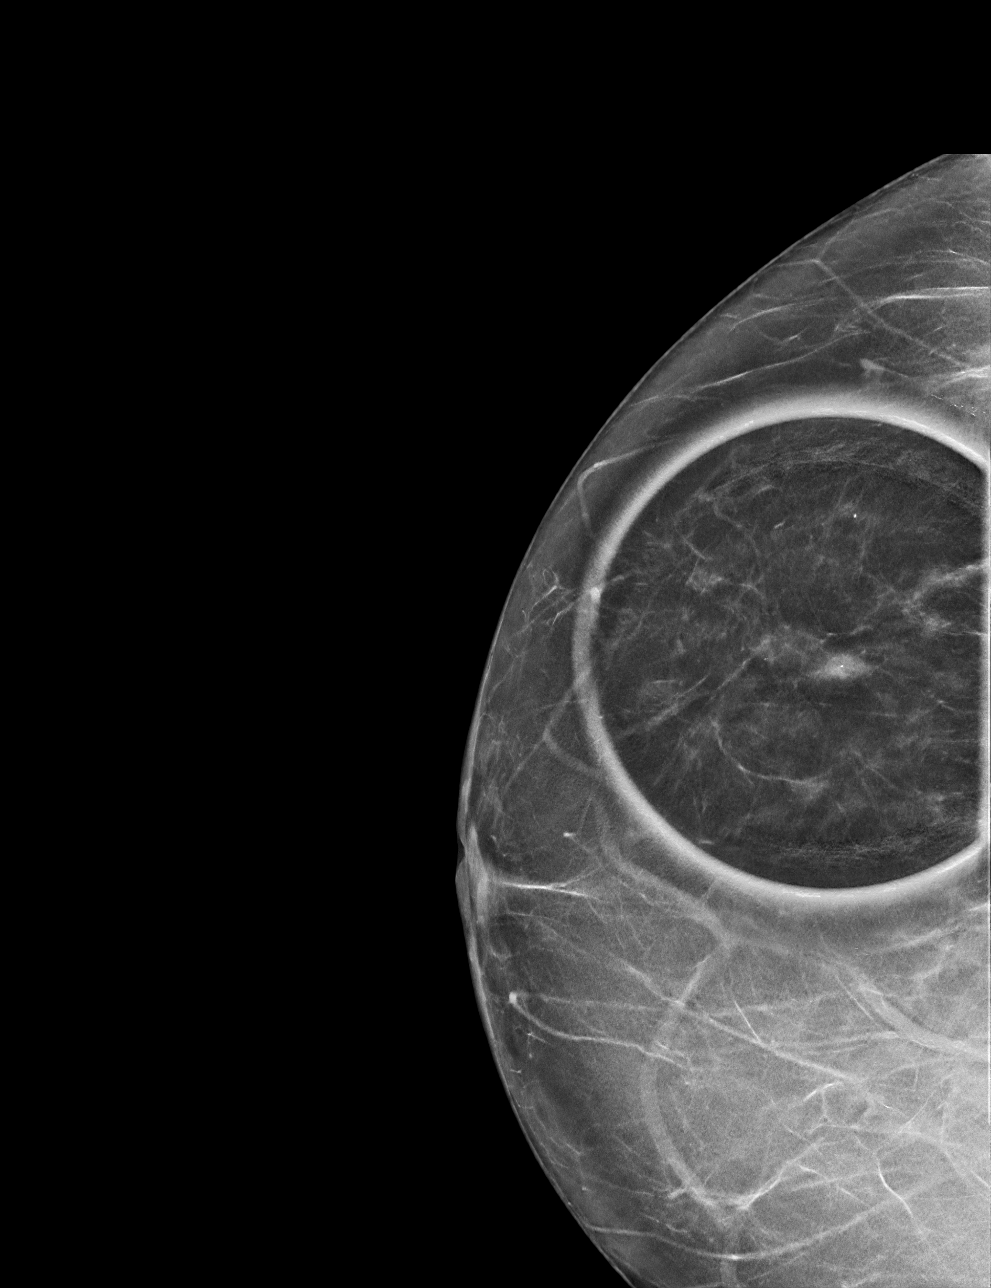

[R MLO synth-2D]
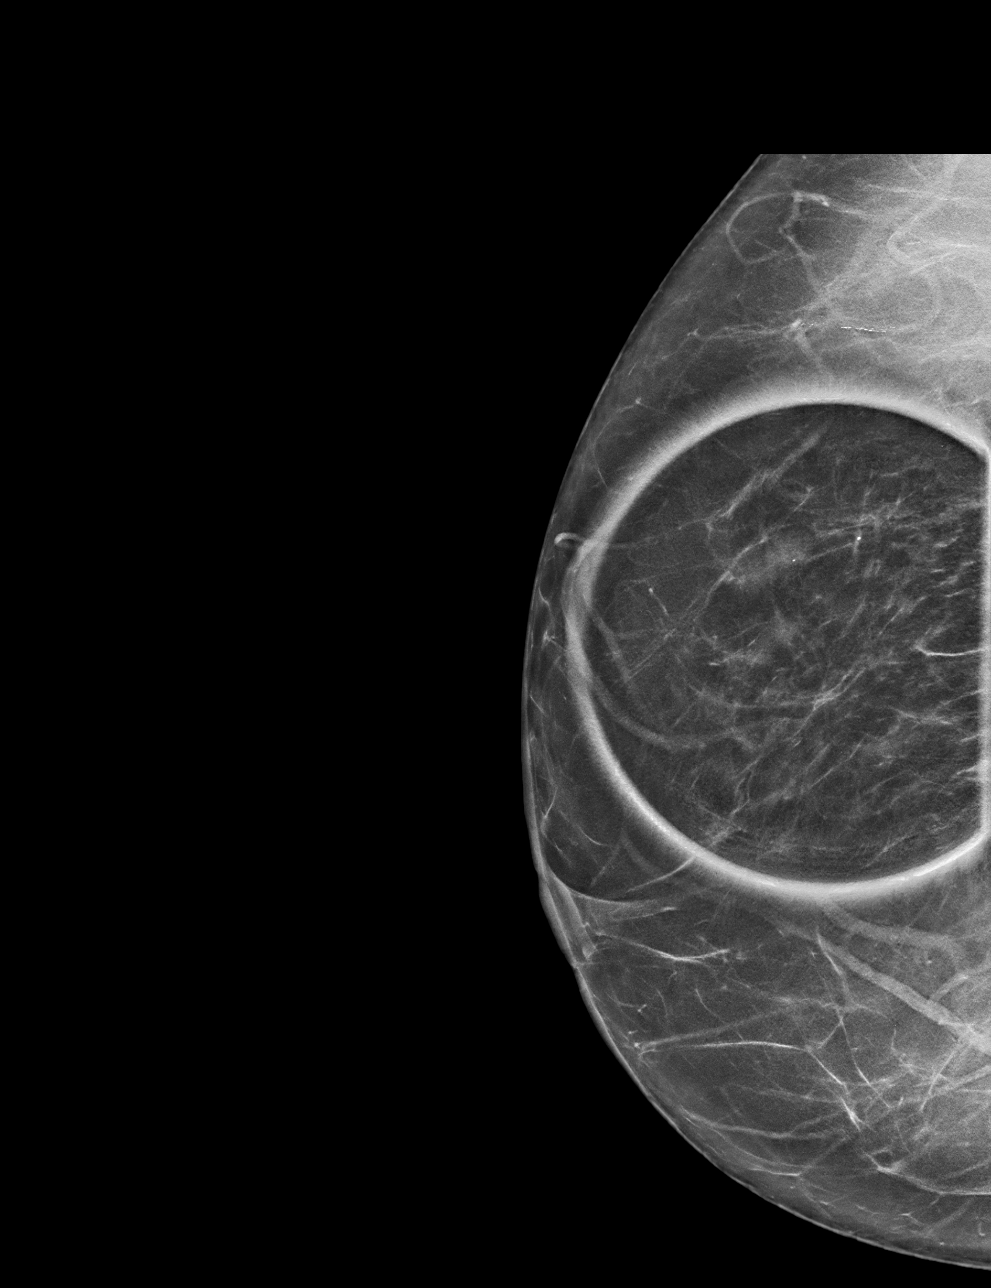

[R MLO tomo · tomo slice 33/66.0]
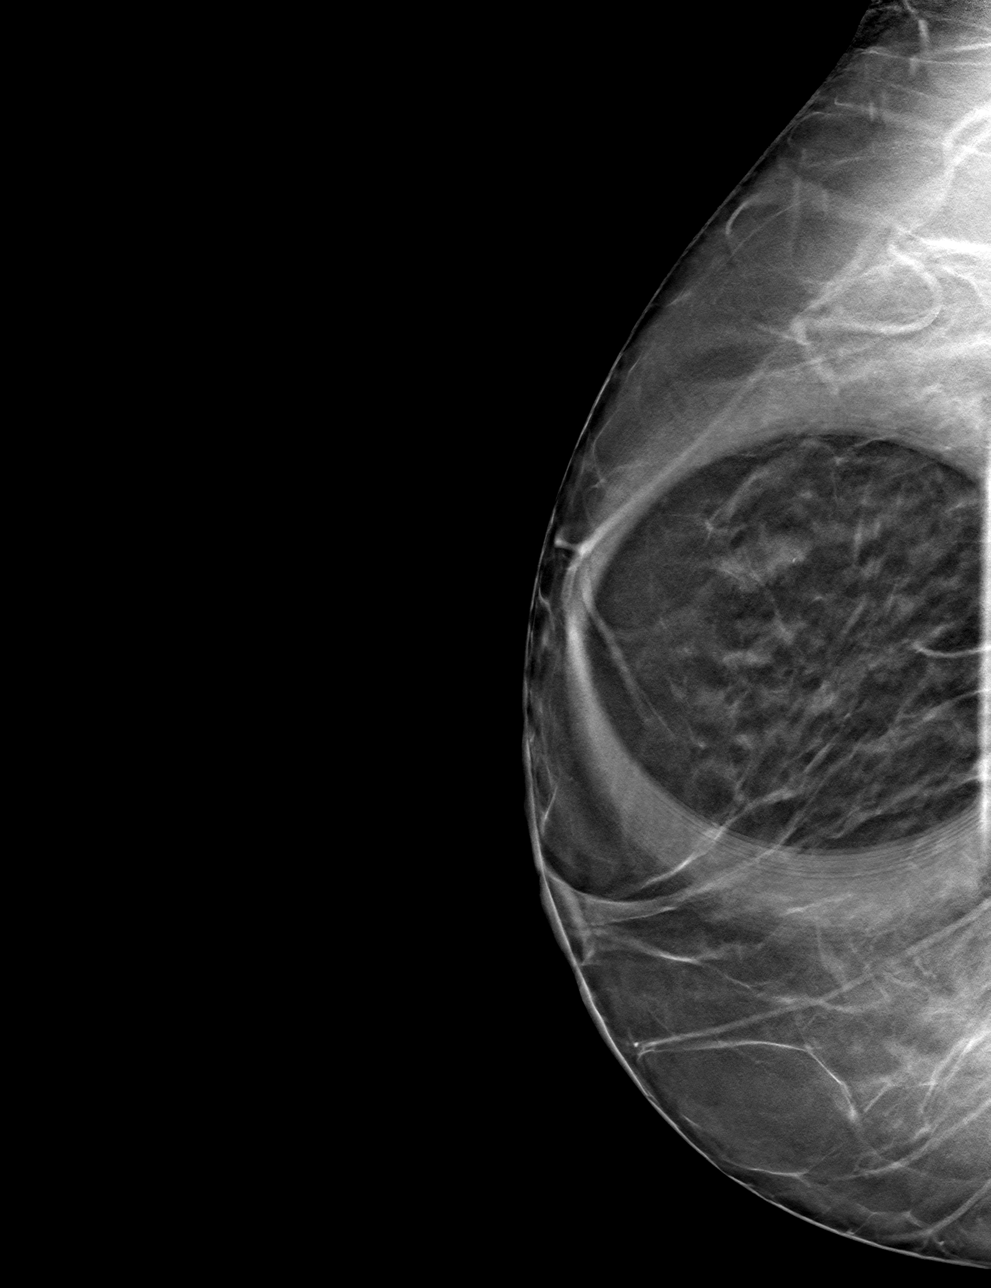

[R CC tomo · tomo slice 29/57.0]
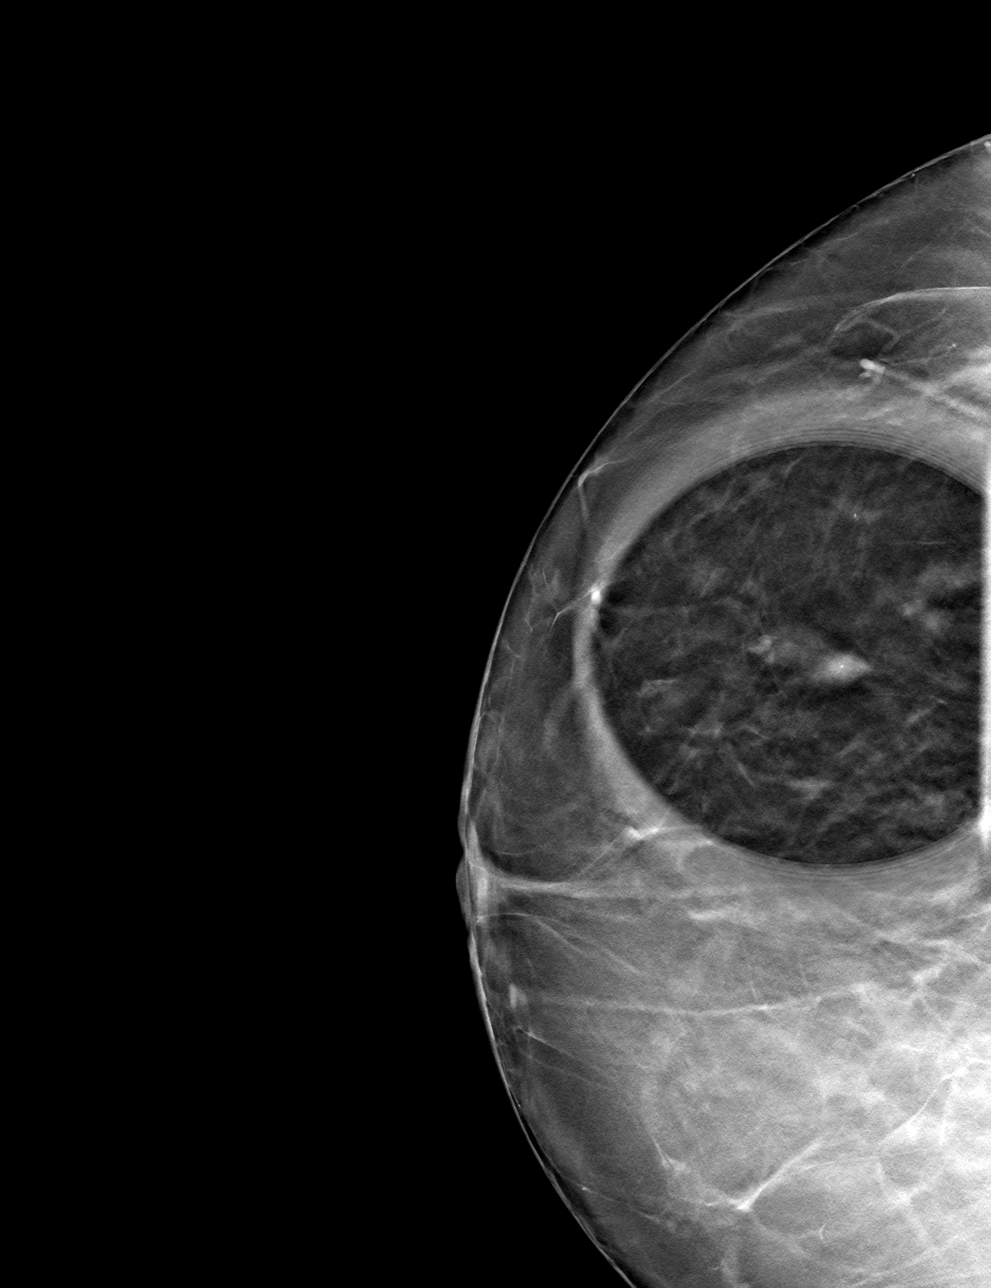

[4 of 12 positions shown; findings below may reference images not displayed]

ACR Breast Density Category b: There are scattered areas of
fibroglandular density.
FINDINGS: Spot compression tomograms were performed of the right breast. There
is an oval mass with a punctate calcification measuring
approximately 0.8 cm.

Targeted ultrasound of the upper-outer quadrant of the right breast
was performed. There is a cyst at the 11 o'clock position 3 cm from
nipple measuring 0.5 x 0.4 x 0.8 cm. An adjacent cluster of
microcysts containing calcifications also at 11 o'clock 3 cm from
nipple measures 0.3 x 0.2 x 0.4 cm. No suspicious masses or
abnormality seen in the outer right breast.

Mammographic images were processed with CAD.
IMPRESSION: No findings of malignancy in the right breast.

RECOMMENDATION:
Recommend annual routine screening mammography, due February 2019.

I have discussed the findings and recommendations with the patient.
Results were also provided in writing at the conclusion of the
visit. If applicable, a reminder letter will be sent to the patient
regarding the next appointment.

BI-RADS CATEGORY  2: Benign.

## 2021-04-05 DIAGNOSIS — Z01419 Encounter for gynecological examination (general) (routine) without abnormal findings: Secondary | ICD-10-CM | POA: Diagnosis not present

## 2021-04-05 DIAGNOSIS — Z6841 Body Mass Index (BMI) 40.0 and over, adult: Secondary | ICD-10-CM | POA: Diagnosis not present

## 2021-04-06 DIAGNOSIS — Z13820 Encounter for screening for osteoporosis: Secondary | ICD-10-CM | POA: Diagnosis not present

## 2021-04-06 DIAGNOSIS — Z1231 Encounter for screening mammogram for malignant neoplasm of breast: Secondary | ICD-10-CM | POA: Diagnosis not present

## 2021-04-07 ENCOUNTER — Institutional Professional Consult (permissible substitution): Payer: BC Managed Care – PPO | Admitting: Plastic Surgery

## 2021-04-14 ENCOUNTER — Institutional Professional Consult (permissible substitution): Payer: BC Managed Care – PPO | Admitting: Plastic Surgery

## 2021-06-13 DIAGNOSIS — I10 Essential (primary) hypertension: Secondary | ICD-10-CM | POA: Diagnosis not present

## 2021-06-13 DIAGNOSIS — R0789 Other chest pain: Secondary | ICD-10-CM | POA: Diagnosis not present

## 2021-06-13 DIAGNOSIS — I517 Cardiomegaly: Secondary | ICD-10-CM | POA: Diagnosis not present

## 2021-06-17 NOTE — Progress Notes (Addendum)
Cardiology Office Note:    Date:  06/19/2021   ID:  Deanna [name], DOB 1955/03/29, MRN [id number]  PCP:  Deanna Joe, MD   [facility] Providers Cardiologist:  Christell Constant, MD     Referring MD: Deanna Joe, MD   CC: Elevated blood pressures Consulted for the evaluation of CP at the behest of Deanna Joe, MD  History of Present Illness:    [name] is a 66 y.o. female with a hx of  HTN, Morbid Obesity, Family history of hypertrophic cardiomyopathy, who presents for new CP.  Former Radio broadcast assistant patient.   Patient notes that she is doing poorly. Notes that she has been having chest discomfort without pain.  This atypical feeling occurs with sitting and comes and goes; happened 5 times in church. No changes with stationary biking or walking. There are no interval hospital/ED visit.    Notes no chest pain; sometimes feels like she needs to stretch. Unclear if its anxiety. No SOB but DOE with stairs.  Lost 13 lbs intentionally through diet and exercise.  Feels rare heart palpitations worse on her current thyroid medications. This is different that her chest feeling.   No post prandial sx. Does not find herself in extreme heat.   Past Medical History:  Diagnosis Date   Cardiomegaly    Diastasis recti    Edema    Elevated LDL cholesterol level    Hypertension    Hypothyroidism    Joint pain    LVH (left ventricular hypertrophy)    Morbid obesity (HCC)    Myalgia    Myositis    Osteoarthritis    OVERWEIGHT/OBESITY 12/20/2009   Qualifier: Diagnosis of  By: Daleen Squibb, MD, Glade Lloyd C    Palpitations 12/20/2009   Qualifier: Diagnosis of  By: Daleen Squibb, MD, Glade Lloyd C    Rhinitis    Rosacea    Shortness of breath 12/20/2009   Qualifier: Diagnosis of  By: Daleen Squibb, MD, Lorenza Cambridge    Situational stress    Thyroid disease    THYROID DISEASE, HX OF 12/20/2009   Qualifier: Diagnosis of  By: Daleen Squibb, MD, Lorenza Cambridge    Vitamin D deficiency     Past  Surgical History:  Procedure Laterality Date   CERVICAL FUSION     CHOLECYSTECTOMY     FOOT SURGERY     HAND SURGERY     TUBAL LIGATION      Current Medications: Current Meds  Medication Sig   albuterol (VENTOLIN HFA) 108 (90 Base) MCG/ACT inhaler as needed.   hydrochlorothiazide (HYDRODIURIL) 25 MG tablet Take 25 mg by mouth daily.   levothyroxine (SYNTHROID, LEVOTHROID) 150 MCG tablet Take 150 mcg by mouth daily before breakfast.   losartan (COZAAR) 100 MG tablet Take 100 mg by mouth daily.   metoprolol tartrate (LOPRESSOR) 25 MG tablet Take 0.5 tablets (12.5 mg total) by mouth 2 (two) times daily.     Allergies:   Cyclobenzaprine and Diclofenac   Social History   Socioeconomic History   Marital status: Married    Spouse name: Not on file   Number of children: Not on file   Years of education: Not on file   Highest education level: Not on file  Occupational History   Not on file  Tobacco Use   Smoking status: Never   Smokeless tobacco: Never  Substance and Sexual Activity   Alcohol use: No   Drug use: Never   Sexual activity: Not on file  Other Topics Concern   Not on file  Social History Narrative   Not on file   Social Determinants of Health   Financial Resource Strain: Not on file  Food Insecurity: Not on file  Transportation Needs: Not on file  Physical Activity: Not on file  Stress: Not on file  Social Connections: Not on file     Family History: The patient's family history includes Asthma in an other family member; Congestive Heart Failure (age of onset: 10) in her father; Diabetes in an other family member; Diverticulosis in an other family member; Hyperlipidemia in an other family member; Hypertension in an other family member; Kidney disease in an other family member.  ROS:   Please see the history of present illness.     All other systems reviewed and are negative.  EKGs/Labs/Other Studies Reviewed:    The following studies were reviewed  today:  EKG:  EKG is  ordered today.  The ekg ordered today demonstrates  06/19/21: SR rate 65, LVH  Recent Labs: No results found for requested labs within last 8760 hours.  Recent Lipid Panel No results found for: CHOL, TRIG, HDL, CHOLHDL, VLDL, LDLCALC, LDLDIRECT      Physical Exam:    VS:  BP (!) 150/95   Pulse 65   Ht 5\' 5"  (1.651 m)   Wt 237 lb (107.5 kg)   SpO2 97%   BMI 39.44 kg/m     Wt Readings from Last 3 Encounters:  06/19/21 237 lb (107.5 kg)  08/27/16 256 lb 6.4 oz (116.3 kg)  12/21/13 247 lb (112 kg)     Gen: No distress, Morbid Obesity Neck: No JVD,  Cardiac: No Rubs or Gallops, dynamic  Murmur worse with standing and Valsalva, RRR +2 radial pulses Respiratory: Clear to auscultation bilaterally, normal effort, normal  respiratory rate GI: Soft, nontender, non-distended  MS: No  edema;  moves all extremities Integument: Skin feels warm Neuro:  At time of evaluation, alert and oriented to person/place/time/situation  Psych: Normal affect, patient feels pl   ASSESSMENT:    1. Family history of hypertrophic cardiomyopathy   2. DOE (dyspnea on exertion)    PLAN:    CP HTN and Morbid Obesity Family history of HCM New systolic murmur - I am concerned patient could have oHCM given history and new murmur - will get ZioPatch as some of this may be related to palpitations - will get echocardiogram - work up negative and still having sx will consider CCTA - Patient we restart Amb BP monitoring - continue current ARB and HCTZ, ideally would stop HCTZ if LVOT gradient but has needed it for BP and to keep euvolemia - If evidence of HCM will get CMR; she cannot do stress imaging because of hip pain - has 5 children, mother passed of stroke without genetic testing  Three months with me (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  Concerns regarding  medicines are outlined above.  Orders Placed This Encounter  Procedures   LONG TERM MONITOR (3-14 DAYS)   EKG 12-Lead   ECHOCARDIOGRAM COMPLETE   Meds ordered this encounter  Medications   metoprolol tartrate (LOPRESSOR) 25 MG tablet    Sig: Take 0.5 tablets (12.5 mg total) by mouth 2 (two) times daily.    Dispense:  90 tablet    Refill:  3    Patient Instructions  Medication Instructions:  Your physician has recommended you make the following change in your medication:  START: metoprolol tartrate (Lopressor) 12.5 (1/2 tablet) by mouth twice daily  *If you need a refill on your cardiac medications before your next appointment, please call your pharmacy*   Lab Work: NONE If you have labs (blood work) drawn today and your tests are completely normal, you will receive your results only by: MyChart Message (if you have MyChart) OR A paper copy in the mail If you have any lab test that is abnormal or we need to change your treatment, we will call you to review the results.   Testing/Procedures: Your physician has requested that you have an echocardiogram. Echocardiography is a painless test that uses sound waves to create images of your heart. It provides your doctor with information about the size and shape of your heart and how well your heart's chambers and valves are working. This procedure takes approximately one hour. There are no restrictions for this procedure.  Your physician has requested that you wear a 14 day heart monitor.    Follow-Up: At [facility], you and your health needs are our priority.  As part of our continuing mission to provide you with exceptional heart care, we have created designated Provider Care Teams.  These Care Teams include your primary Cardiologist (physician) and Advanced Practice Providers (APPs -  Physician Assistants and Nurse Practitioners) who all work together to provide you with the care you need, when you need it.    Your next  appointment:   3 month(s)  The format for your next appointment:   In Person  Provider:   Christell Constant, MD     Other Instructions  Please monitor your Blood Pressure and keep a record of readings.  ZIO XT- Long Term Monitor Instructions  Your physician has requested you wear a ZIO patch monitor for 14 days.  This is a single patch monitor. [employer] supplies one patch monitor per enrollment. Additional stickers are not available. Please do not apply patch if you will be having a Nuclear Stress Test,  Echocardiogram, Cardiac CT, MRI, or Chest Xray during the period you would be wearing the  monitor. The patch cannot be worn during these tests. You cannot remove and re-apply the  ZIO XT patch monitor.  Your ZIO patch monitor will be mailed 3 day USPS to your address on file. It may take 3-5 days  to receive your monitor after you have been enrolled.  Once you have received your monitor, please review the enclosed instructions. Your monitor  has already been registered assigning a specific monitor serial # to you.  Billing and Patient Assistance Program Information  We have supplied [employer] with any of your insurance information on file for billing purposes. [employer] offers a sliding scale Patient Assistance Program for patients that do not have  insurance, or whose insurance does not completely cover the cost of the ZIO monitor.  You must apply for the Patient Assistance Program to qualify for this discounted rate.  To apply, please call [employer] at [phone], select option 4, select option 2, ask to apply for  Patient Assistance Program. Meredeth Ide will ask your household income, and how many people  are in your household. They will quote your out-of-pocket cost based on that information.  [employer] will also be able to set up a 41-month, interest-free payment plan if needed.  Applying the monitor   Shave hair from upper left chest.  Hold abrader disc by orange  tab. Rub  abrader in 40 strokes over the upper left chest as  indicated in your monitor instructions.  Clean area with 4 enclosed alcohol pads. Let dry.  Apply patch as indicated in monitor instructions. Patch will be placed under collarbone on left  side of chest with arrow pointing upward.  Rub patch adhesive wings for 2 minutes. Remove white label marked "1". Remove the white  label marked "2". Rub patch adhesive wings for 2 additional minutes.  While looking in a mirror, press and release button in center of patch. A small green light will  flash 3-4 times. This will be your only indicator that the monitor has been turned on.  Do not shower for the first 24 hours. You may shower after the first 24 hours.  Press the button if you feel a symptom. You will hear a small click. Record Date, Time and  Symptom in the Patient Logbook.  When you are ready to remove the patch, follow instructions on the last 2 pages of Patient  Logbook. Stick patch monitor onto the last page of Patient Logbook.  Place Patient Logbook in the blue and white box. Use locking tab on box and tape box closed  securely. The blue and white box has prepaid postage on it. Please place it in the mailbox as  soon as possible. Your physician should have your test results approximately 7 days after the  monitor has been mailed back to [facility].  Call [facility] Customer Care at [phone] if you have questions regarding  your ZIO XT patch monitor. Call them immediately if you see an orange light blinking on your  monitor.  If your monitor falls off in less than 4 days, contact our Monitor department at [phone].  If your monitor becomes loose or falls off after 4 days call [employer] at [phone] for  suggestions on securing your monitor     Signed, Christell Constant, MD  06/19/2021 2:16 PM    [facility]

## 2021-06-19 ENCOUNTER — Ambulatory Visit: Payer: BC Managed Care – PPO | Admitting: Internal Medicine

## 2021-06-19 ENCOUNTER — Encounter: Payer: Self-pay | Admitting: Internal Medicine

## 2021-06-19 ENCOUNTER — Ambulatory Visit (INDEPENDENT_AMBULATORY_CARE_PROVIDER_SITE_OTHER): Payer: BC Managed Care – PPO

## 2021-06-19 ENCOUNTER — Other Ambulatory Visit: Payer: Self-pay | Admitting: Internal Medicine

## 2021-06-19 ENCOUNTER — Other Ambulatory Visit: Payer: Self-pay

## 2021-06-19 VITALS — BP 150/95 | HR 65 | Ht 65.0 in | Wt 237.0 lb

## 2021-06-19 DIAGNOSIS — Z8249 Family history of ischemic heart disease and other diseases of the circulatory system: Secondary | ICD-10-CM

## 2021-06-19 DIAGNOSIS — R0609 Other forms of dyspnea: Secondary | ICD-10-CM

## 2021-06-19 DIAGNOSIS — R002 Palpitations: Secondary | ICD-10-CM

## 2021-06-19 MED ORDER — METOPROLOL TARTRATE 25 MG PO TABS: 12.5000 mg | ORAL_TABLET | Freq: Two times a day (BID) | ORAL | 3 refills | Status: DC

## 2021-06-19 NOTE — Patient Instructions (Signed)
Medication Instructions:  ?Your physician has recommended you make the following change in your medication:  ?START: metoprolol tartrate (Lopressor) 12.5 (1/2 tablet) by mouth twice daily  ?*If you need a refill on your cardiac medications before your next appointment, please call your pharmacy* ? ? ?Lab Work: ?NONE ?If you have labs (blood work) drawn today and your tests are completely normal, you will receive your results only by: ?MyChart Message (if you have MyChart) OR ?A paper copy in the mail ?If you have any lab test that is abnormal or we need to change your treatment, we will call you to review the results. ? ? ?Testing/Procedures: ?Your physician has requested that you have an echocardiogram. Echocardiography is a painless test that uses sound waves to create images of your heart. It provides your doctor with information about the size and shape of your heart and how well your heart?s chambers and valves are working. This procedure takes approximately one hour. There are no restrictions for this procedure. ? ?Your physician has requested that you wear a 14 day heart monitor.  ? ? ?Follow-Up: ?At [facility], you and your health needs are our priority.  As part of our continuing mission to provide you with exceptional heart care, we have created designated Provider Care Teams.  These Care Teams include your primary Cardiologist (physician) and Advanced Practice Providers (APPs -  Physician Assistants and Nurse Practitioners) who all work together to provide you with the care you need, when you need it. ?  ? ?Your next appointment:   ?3 month(s) ? ?The format for your next appointment:   ?In Person ? ?Provider:   ?Werner Lean, MD   ? ? ?Other Instructions ? ?Please monitor your Blood Pressure and keep a record of readings. ? ?ZIO XT- Long Term Monitor Instructions ? ?Your physician has requested you wear a ZIO patch monitor for 14 days.  ?This is a single patch monitor. [employer] supplies one  patch monitor per enrollment. Additional ?stickers are not available. Please do not apply patch if you will be having a Nuclear Stress Test,  ?Echocardiogram, Cardiac CT, MRI, or Chest Xray during the period you would be wearing the  ?monitor. The patch cannot be worn during these tests. You cannot remove and re-apply the  ?ZIO XT patch monitor.  ?Your ZIO patch monitor will be mailed 3 day USPS to your address on file. It may take 3-5 days  ?to receive your monitor after you have been enrolled.  ?Once you have received your monitor, please review the enclosed instructions. Your monitor  ?has already been registered assigning a specific monitor serial # to you. ? ?Billing and Patient Assistance Program Information ? ?We have supplied [employer] with any of your insurance information on file for billing purposes. ?[employer] offers a sliding scale Patient Assistance Program for patients that do not have  ?insurance, or whose insurance does not completely cover the cost of the ZIO monitor.  ?You must apply for the Patient Assistance Program to qualify for this discounted rate.  ?To apply, please call [employer] at [phone], select option 4, select option 2, ask to apply for  ?Patient Assistance Program. Theodore Demark will ask your household income, and how many people  ?are in your household. They will quote your out-of-pocket cost based on that information.  ?[employer] will also be able to set up a 55-month, interest-free payment plan if needed. ? ?Applying the monitor ?  ?Shave hair from upper left chest.  ?Hold abrader  disc by orange tab. Rub abrader in 40 strokes over the upper left chest as  ?indicated in your monitor instructions.  ?Clean area with 4 enclosed alcohol pads. Let dry.  ?Apply patch as indicated in monitor instructions. Patch will be placed under collarbone on left  ?side of chest with arrow pointing upward.  ?Rub patch adhesive wings for 2 minutes. Remove white label marked "1". Remove the white  ?label marked  "2". Rub patch adhesive wings for 2 additional minutes.  ?While looking in a mirror, press and release button in center of patch. A small green light will  ?flash 3-4 times. This will be your only indicator that the monitor has been turned on.  ?Do not shower for the first 24 hours. You may shower after the first 24 hours.  ?Press the button if you feel a symptom. You will hear a small click. Record Date, Time and  ?Symptom in the Patient Logbook.  ?When you are ready to remove the patch, follow instructions on the last 2 pages of Patient  ?Logbook. Stick patch monitor onto the last page of Patient Logbook.  ?Place Patient Logbook in the blue and white box. Use locking tab on box and tape box closed  ?securely. The blue and white box has prepaid postage on it. Please place it in the mailbox as  ?soon as possible. Your physician should have your test results approximately 7 days after the  ?monitor has been mailed back to [facility].  ?Call [facility] at [phone] if you have questions regarding  ?your ZIO XT patch monitor. Call them immediately if you see an orange light blinking on your  ?monitor.  ?If your monitor falls off in less than 4 days, contact our Monitor department at [phone].  ?If your monitor becomes loose or falls off after 4 days call [employer] at [phone] for  ?suggestions on securing your monitor   ?

## 2021-06-19 NOTE — Progress Notes (Unsigned)
Enrolled for [employer] to mail a ZIO XT long term holter monitor to the patients address on file.  

## 2021-06-27 ENCOUNTER — Ambulatory Visit (HOSPITAL_COMMUNITY): Payer: BC Managed Care – PPO | Attending: Cardiology

## 2021-06-27 DIAGNOSIS — E785 Hyperlipidemia, unspecified: Secondary | ICD-10-CM | POA: Diagnosis not present

## 2021-06-27 DIAGNOSIS — Z8249 Family history of ischemic heart disease and other diseases of the circulatory system: Secondary | ICD-10-CM | POA: Diagnosis not present

## 2021-06-27 DIAGNOSIS — I10 Essential (primary) hypertension: Secondary | ICD-10-CM | POA: Insufficient documentation

## 2021-06-27 DIAGNOSIS — R002 Palpitations: Secondary | ICD-10-CM

## 2021-06-27 DIAGNOSIS — I351 Nonrheumatic aortic (valve) insufficiency: Secondary | ICD-10-CM | POA: Diagnosis not present

## 2021-06-27 DIAGNOSIS — R0609 Other forms of dyspnea: Secondary | ICD-10-CM | POA: Diagnosis not present

## 2021-06-27 DIAGNOSIS — R0602 Shortness of breath: Secondary | ICD-10-CM | POA: Diagnosis not present

## 2021-06-27 LAB — ECHOCARDIOGRAM COMPLETE
Area-P 1/2: 3.53 cm2
P 1/2 time: 499 msec
S' Lateral: 2.4 cm

## 2021-07-24 ENCOUNTER — Telehealth: Payer: Self-pay | Admitting: Internal Medicine

## 2021-07-24 MED ORDER — METOPROLOL TARTRATE 25 MG PO TABS: 25.0000 mg | ORAL_TABLET | Freq: Two times a day (BID) | ORAL | 3 refills | Status: DC

## 2021-07-24 NOTE — Telephone Encounter (Signed)
-----   Message from Christell Constant, MD sent at 07/23/2021  8:03 PM EDT ----- ?Results: ?Rare P-SVT ?Plan: ?Increase metoprolol to 25 mg PO BID ? ?Christell Constant, MD ? ?

## 2021-07-24 NOTE — Telephone Encounter (Signed)
The patient has been notified of the result and verbalized understanding.  All questions (if any) were answered. ?Macie Burows, RN 07/24/2021 3:10 PM   ?

## 2021-07-24 NOTE — Telephone Encounter (Signed)
Pt called in just returning  Macie Burows call about her Monitor results ? ?Best number is [phone] ?

## 2021-09-01 ENCOUNTER — Encounter: Payer: Self-pay | Admitting: Internal Medicine

## 2021-09-01 ENCOUNTER — Ambulatory Visit (INDEPENDENT_AMBULATORY_CARE_PROVIDER_SITE_OTHER): Payer: BC Managed Care – PPO | Admitting: Internal Medicine

## 2021-09-01 VITALS — BP 138/70 | HR 66 | Ht 65.0 in | Wt 245.0 lb

## 2021-09-01 DIAGNOSIS — Z8249 Family history of ischemic heart disease and other diseases of the circulatory system: Secondary | ICD-10-CM | POA: Diagnosis not present

## 2021-09-01 DIAGNOSIS — I351 Nonrheumatic aortic (valve) insufficiency: Secondary | ICD-10-CM | POA: Diagnosis not present

## 2021-09-01 DIAGNOSIS — I10 Essential (primary) hypertension: Secondary | ICD-10-CM

## 2021-09-01 DIAGNOSIS — E782 Mixed hyperlipidemia: Secondary | ICD-10-CM

## 2021-09-01 DIAGNOSIS — I77819 Aortic ectasia, unspecified site: Secondary | ICD-10-CM | POA: Insufficient documentation

## 2021-09-01 DIAGNOSIS — I712 Thoracic aortic aneurysm, without rupture, unspecified: Secondary | ICD-10-CM | POA: Diagnosis not present

## 2021-09-01 NOTE — Progress Notes (Signed)
Cardiology Office Note:    Date:  09/01/2021   ID:  Deanna [name], DOB [birth year], MRN [id number]  PCP:  Antony Contras, MD   [facility] Cardiologist:  Werner Lean, MD     Referring MD: Antony Contras, MD   CC: chest discomfort follow up  History of Present Illness:    Deanna [name] is a 66 y.o. female with a hx of  HTN, Morbid Obesity, Family history of hypertrophic cardiomyopathy (mother, no genetic testing available), who presents for new CP. Former Designer, industrial/product patient.  2023: Echo showed no LVOT gradient (resting study- could not do stress testing because of hip pain) or thickness suggestive of HCM.    Patient notes that she is doing fine.   Goes for walks on her lunch break and is doing fine. Is going to start some chair exercises. There are no interval hospital/ED visit.    No chest pain or pressure.  No SOB; does get out of breath with some exercise and no PND/Orthopnea.  No weight gain or leg swelling.  No palpitations or syncope.   Past Medical History:  Diagnosis Date   Cardiomegaly    Diastasis recti    Edema    Elevated LDL cholesterol level    Hypertension    Hypothyroidism    Joint pain    LVH (left ventricular hypertrophy)    Morbid obesity ([place])    Myalgia    Myositis    Osteoarthritis    OVERWEIGHT/OBESITY 12/20/2009   Qualifier: Diagnosis of  By: Verl Blalock, MD, Estevan [place] C    Palpitations 12/20/2009   Qualifier: Diagnosis of  By: Verl Blalock, MD, Estevan Oaks C    Rhinitis    Rosacea    Shortness of breath 12/20/2009   Qualifier: Diagnosis of  By: Verl Blalock, MD, Delanna Ahmadi    Situational stress    Thyroid disease    THYROID DISEASE, HX OF 12/20/2009   Qualifier: Diagnosis of  By: Verl Blalock, MD, Delanna Ahmadi    Vitamin D deficiency     Past Surgical History:  Procedure Laterality Date   CERVICAL FUSION     CHOLECYSTECTOMY     FOOT SURGERY     HAND SURGERY     TUBAL LIGATION      Current Medications: Current Meds  Medication  Sig   albuterol (VENTOLIN HFA) 108 (90 Base) MCG/ACT inhaler as needed.   hydrochlorothiazide (HYDRODIURIL) 25 MG tablet Take 25 mg by mouth daily.   levothyroxine (SYNTHROID, LEVOTHROID) 150 MCG tablet Take 150 mcg by mouth daily before breakfast.   losartan (COZAAR) 100 MG tablet Take 100 mg by mouth daily.   metoprolol tartrate (LOPRESSOR) 25 MG tablet Take 1 tablet (25 mg total) by mouth 2 (two) times daily.     Allergies:   Cyclobenzaprine and Diclofenac   Social History   Socioeconomic History   Marital status: Married    Spouse name: Not on file   Number of children: Not on file   Years of education: Not on file   Highest education level: Not on file  Occupational History   Not on file  Tobacco Use   Smoking status: Never   Smokeless tobacco: Never  Substance and Sexual Activity   Alcohol use: No   Drug use: Never   Sexual activity: Not on file  Other Topics Concern   Not on file  Social History Narrative   Not on file   Social Determinants of Health   Financial  Resource Strain: Not on file  Food Insecurity: Not on file  Transportation Needs: Not on file  Physical Activity: Not on file  Stress: Not on file  Social Connections: Not on file    Social: Biochemist, clinical at a  [employer]  Family History: The patient's family history includes Asthma in an other family member; Congestive Heart Failure (age of onset: 63) in her father; Diabetes in an other family member; Diverticulosis in an other family member; Hyperlipidemia in an other family member; Hypertension in an other family member; Kidney disease in an other family member.  ROS:   Please see the history of present illness.     All other systems reviewed and are negative.  EKGs/Labs/Other Studies Reviewed:    The following studies were reviewed today:  EKG:  06/19/21: SR rate 65, LVH  Recent Labs: No results found for requested labs within last 365 days.  Recent Lipid Panel No results found  for: "CHOL", "TRIG", "HDL", "CHOLHDL", "VLDL", "LDLCALC", "LDLDIRECT"      Physical Exam:    VS:  BP 138/70   Pulse 66   Ht 5\' 5"  (1.651 m)   Wt 245 lb (111.1 kg)   SpO2 96%   BMI 40.77 kg/m     Wt Readings from Last 3 Encounters:  09/01/21 245 lb (111.1 kg)  06/19/21 237 lb (107.5 kg)  08/27/16 256 lb 6.4 oz (116.3 kg)    Gen: No distress, morbid obesity Neck: No JVD Cardiac: No Rubs or Gallops, soft diastolic murmur, RRR +2 radial pulses Respiratory: Clear to auscultation bilaterally, normal effort, normal  respiratory rate GI: Soft, nontender, non-distended, no abdominal bruit or pulsation MS: No edema;  moves all extremities Integument: Skin feels warm Neuro:  At time of evaluation, alert and oriented to person/place/time/situation  Psych: Normal affect, patient feels well   ASSESSMENT:    1. Mixed hyperlipidemia   2. Thoracic aortic aneurysm without rupture, unspecified part ([place])   3. Nonrheumatic aortic valve insufficiency   4. Family history of hypertrophic cardiomyopathy   5. Morbid obesity ([place])   6. Essential hypertension     PLAN:    HLD - will send lipids today and Lp(a) - if starting therapy will also offer CAC CT ( we discussed other screening testing)  HTN and Morbid Obesity - Amb BP monitoring: not done yet; will start - continue current ARB and HCTZ (has tolerated this well with no LVOT gradient) - we have discussed water aerobics  Mild AI Mild TAA Family history of HCM - Next screening echo 2028 unless other indications - would avoid fluoroquinolones - reviewed Echo  One year with me     Medication Adjustments/Labs and Tests Ordered: Current medicines are reviewed at length with the patient today.  Concerns regarding medicines are outlined above.  Orders Placed This Encounter  Procedures   Lipoprotein A (LPA)   Lipid panel   ECHOCARDIOGRAM COMPLETE   No orders of the defined types were placed in this encounter.   Patient  Instructions  Medication Instructions:  Your physician recommends that you continue on your current medications as directed. Please refer to the Current Medication list given to you today.  *If you need a refill on your cardiac medications before your next appointment, please call your pharmacy*   Lab Work: TODAY:FLP, Lipoprotein A If you have labs (blood work) drawn today and your tests are completely normal, you will receive your results only by: [place] (if you have MyChart) OR  A paper copy in the mail If you have any lab test that is abnormal or we need to change your treatment, we will call you to review the results.   Testing/Procedures: IN MAY 2024- Your physician has requested that you have an echocardiogram. Echocardiography is a painless test that uses sound waves to create images of your heart. It provides your doctor with information about the size and shape of your heart and how well your heart's chambers and valves are working. This procedure takes approximately one hour. There are no restrictions for this procedure.    Follow-Up: At [facility], you and your health needs are our priority.  As part of our continuing mission to provide you with exceptional heart care, we have created designated Provider Care Teams.  These Care Teams include your primary Cardiologist (physician) and Advanced Practice Providers (APPs -  Physician Assistants and Nurse Practitioners) who all work together to provide you with the care you need, when you need it.  We recommend signing up for the patient portal called "MyChart".  Sign up information is provided on this After Visit Summary.  MyChart is used to connect with patients for Virtual Visits (Telemedicine).  Patients are able to view lab/test results, encounter notes, upcoming appointments, etc.  Non-urgent messages can be sent to your provider as well.   To learn more about what you can do with MyChart, go to [email or web address].     Your next appointment:   1 year(s)  The format for your next appointment:   In Person  Provider:   Werner Lean, MD     Other Instructions    Important Information About Sugar         Signed, Werner Lean, MD  09/01/2021 10:32 AM    [place]

## 2021-09-01 NOTE — Patient Instructions (Signed)
Medication Instructions:  Your physician recommends that you continue on your current medications as directed. Please refer to the Current Medication list given to you today.  *If you need a refill on your cardiac medications before your next appointment, please call your pharmacy*   Lab Work: TODAY:FLP, Lipoprotein A If you have labs (blood work) drawn today and your tests are completely normal, you will receive your results only by: MyChart Message (if you have MyChart) OR A paper copy in the mail If you have any lab test that is abnormal or we need to change your treatment, we will call you to review the results.   Testing/Procedures: IN MAY 2024- Your physician has requested that you have an echocardiogram. Echocardiography is a painless test that uses sound waves to create images of your heart. It provides your doctor with information about the size and shape of your heart and how well your heart's chambers and valves are working. This procedure takes approximately one hour. There are no restrictions for this procedure.    Follow-Up: At [facility], you and your health needs are our priority.  As part of our continuing mission to provide you with exceptional heart care, we have created designated Provider Care Teams.  These Care Teams include your primary Cardiologist (physician) and Advanced Practice Providers (APPs -  Physician Assistants and Nurse Practitioners) who all work together to provide you with the care you need, when you need it.  We recommend signing up for the patient portal called "MyChart".  Sign up information is provided on this After Visit Summary.  MyChart is used to connect with patients for Virtual Visits (Telemedicine).  Patients are able to view lab/test results, encounter notes, upcoming appointments, etc.  Non-urgent messages can be sent to your provider as well.   To learn more about what you can do with MyChart, go to [email or web address].    Your next  appointment:   1 year(s)  The format for your next appointment:   In Person  Provider:   Christell Constant, MD     Other Instructions    Important Information About Sugar

## 2021-09-02 LAB — LIPID PANEL
Chol/HDL Ratio: 3.5 ratio (ref 0.0–4.4)
Cholesterol, Total: 171 mg/dL (ref 100–199)
HDL: 49 mg/dL (ref >39)
LDL Chol Calc (NIH): 107 mg/dL — ABNORMAL HIGH (ref 0–99)
Triglycerides: 77 mg/dL (ref 0–149)
VLDL Cholesterol Cal: 15 mg/dL (ref 5–40)

## 2021-09-02 LAB — LIPOPROTEIN A (LPA): Lipoprotein (a): 50.2 nmol/L (ref <75.0)

## 2021-09-06 ENCOUNTER — Telehealth: Payer: Self-pay

## 2021-09-06 DIAGNOSIS — E782 Mixed hyperlipidemia: Secondary | ICD-10-CM

## 2021-09-06 MED ORDER — ROSUVASTATIN CALCIUM 5 MG PO TABS: 5.0000 mg | ORAL_TABLET | Freq: Every day | ORAL | 3 refills | Status: DC

## 2021-09-06 NOTE — Telephone Encounter (Signed)
-----   Message from Christell Constant, MD sent at 09/03/2021  5:54 PM EDT ----- Results: LDL above goal Normal Lp(a) Plan: Offer rosuvastatin 5 and labs in three months; Calcium score  Christell Constant, MD

## 2021-09-06 NOTE — Telephone Encounter (Signed)
The patient has been notified of the result and verbalized understanding.  All questions (if any) were answered. Deanna Burows, RN 09/06/2021 11:24 AM   Fasting labs scheduled for 11-28-21. Advised pt a scheduler will call for Calcium Score Test appointment.

## 2021-09-27 DIAGNOSIS — Z0000 Encounter for general adult medical examination without abnormal findings: Secondary | ICD-10-CM | POA: Diagnosis not present

## 2021-09-27 DIAGNOSIS — E039 Hypothyroidism, unspecified: Secondary | ICD-10-CM | POA: Diagnosis not present

## 2021-09-27 DIAGNOSIS — E559 Vitamin D deficiency, unspecified: Secondary | ICD-10-CM | POA: Diagnosis not present

## 2021-09-27 DIAGNOSIS — I10 Essential (primary) hypertension: Secondary | ICD-10-CM | POA: Diagnosis not present

## 2021-09-27 DIAGNOSIS — E7800 Pure hypercholesterolemia, unspecified: Secondary | ICD-10-CM | POA: Diagnosis not present

## 2021-10-03 ENCOUNTER — Other Ambulatory Visit (HOSPITAL_BASED_OUTPATIENT_CLINIC_OR_DEPARTMENT_OTHER): Payer: BC Managed Care – PPO

## 2021-10-05 ENCOUNTER — Other Ambulatory Visit (HOSPITAL_BASED_OUTPATIENT_CLINIC_OR_DEPARTMENT_OTHER): Payer: BC Managed Care – PPO

## 2021-10-11 ENCOUNTER — Ambulatory Visit (HOSPITAL_BASED_OUTPATIENT_CLINIC_OR_DEPARTMENT_OTHER)
Admission: RE | Admit: 2021-10-11 | Discharge: 2021-10-11 | Disposition: A | Discharge status: Home / Self Care | Payer: BC Managed Care – PPO | Source: Ambulatory Visit | Attending: Internal Medicine | Admitting: Internal Medicine

## 2021-10-11 DIAGNOSIS — E782 Mixed hyperlipidemia: Secondary | ICD-10-CM | POA: Insufficient documentation

## 2021-10-26 DIAGNOSIS — R102 Pelvic and perineal pain: Secondary | ICD-10-CM | POA: Diagnosis not present

## 2021-10-26 DIAGNOSIS — R1032 Left lower quadrant pain: Secondary | ICD-10-CM | POA: Diagnosis not present

## 2021-11-28 ENCOUNTER — Telehealth: Payer: Self-pay | Admitting: Internal Medicine

## 2021-11-28 ENCOUNTER — Other Ambulatory Visit: Payer: BC Managed Care – PPO

## 2021-11-28 DIAGNOSIS — E782 Mixed hyperlipidemia: Secondary | ICD-10-CM

## 2021-11-28 NOTE — Telephone Encounter (Signed)
Changed lab orders to [facility] per pt request.

## 2021-11-28 NOTE — Telephone Encounter (Signed)
Patient calling see if lab orders can be sent to [facility]. Please advise

## 2021-11-28 NOTE — Addendum Note (Signed)
Addended by: Macie Burows on: 11/28/2021 03:29 PM   Modules accepted: Orders

## 2021-11-28 NOTE — Addendum Note (Signed)
Addended by: Macie Burows on: 11/28/2021 03:32 PM   Modules accepted: Orders

## 2021-11-28 NOTE — Telephone Encounter (Signed)
Called pt would like to have labs (FLP and ALT) drawn at [facility].  Reports last time labs were ordered had to pay $120 out of pocket.  Orders from 09/06/21 addendum.  Released orders to be drawn.  Advised pt to call the office with any questions or concerns.

## 2021-11-30 NOTE — Telephone Encounter (Signed)
Mailbox full unable to leave a message.  Would like to see if pt had labs drawn.

## 2021-12-05 DIAGNOSIS — E782 Mixed hyperlipidemia: Secondary | ICD-10-CM

## 2021-12-05 NOTE — Telephone Encounter (Signed)
Called pt in regards to f/u labs.  Reports is currently out of town but will have labs drawn at [facility] facility once back in town.

## 2021-12-11 ENCOUNTER — Ambulatory Visit: Payer: BC Managed Care – PPO | Admitting: Internal Medicine

## 2021-12-20 ENCOUNTER — Other Ambulatory Visit: Payer: Self-pay

## 2021-12-20 ENCOUNTER — Telehealth: Payer: Self-pay | Admitting: Internal Medicine

## 2021-12-20 DIAGNOSIS — E782 Mixed hyperlipidemia: Secondary | ICD-10-CM

## 2021-12-20 NOTE — Telephone Encounter (Signed)
Pt sent this via Mychart to the scheduling pool  "I was trying to schedule a visit to [facility] for my follow-up cholesterol blood test that Dr Gasper Sells ordered because with my insurance there is no cost however I cannot remember which one I need to go to.     Thank you!"    I gave her the info to [facility] but she is needing a written lab order to take with her

## 2021-12-21 DIAGNOSIS — E039 Hypothyroidism, unspecified: Secondary | ICD-10-CM | POA: Diagnosis not present

## 2021-12-22 DIAGNOSIS — E782 Mixed hyperlipidemia: Secondary | ICD-10-CM | POA: Diagnosis not present

## 2021-12-23 LAB — LIPID PANEL
Cholesterol: 139 mg/dL (ref <200)
HDL: 52 mg/dL (ref >=50)
LDL Cholesterol (Calc): 68 mg/dL (calc)
Non-HDL Cholesterol (Calc): 87 mg/dL (calc) (ref <130)
Total CHOL/HDL Ratio: 2.7 (calc) (ref <5.0)
Triglycerides: 111 mg/dL (ref <150)

## 2021-12-23 LAB — ALT: ALT: 15 U/L (ref 6–29)

## 2022-06-12 DIAGNOSIS — M549 Dorsalgia, unspecified: Secondary | ICD-10-CM | POA: Diagnosis not present

## 2022-06-13 DIAGNOSIS — M79644 Pain in right finger(s): Secondary | ICD-10-CM | POA: Diagnosis not present

## 2022-06-21 DIAGNOSIS — M898X1 Other specified disorders of bone, shoulder: Secondary | ICD-10-CM | POA: Diagnosis not present

## 2022-06-21 DIAGNOSIS — M549 Dorsalgia, unspecified: Secondary | ICD-10-CM | POA: Diagnosis not present

## 2022-07-11 ENCOUNTER — Other Ambulatory Visit: Payer: Self-pay | Admitting: Obstetrics and Gynecology

## 2022-07-11 DIAGNOSIS — Z1231 Encounter for screening mammogram for malignant neoplasm of breast: Secondary | ICD-10-CM

## 2022-07-17 ENCOUNTER — Ambulatory Visit
Admission: RE | Admit: 2022-07-17 | Discharge: 2022-07-17 | Disposition: A | Discharge status: Home / Self Care | Payer: BC Managed Care – PPO | Source: Ambulatory Visit | Attending: Obstetrics and Gynecology | Admitting: Obstetrics and Gynecology

## 2022-07-17 DIAGNOSIS — Z1231 Encounter for screening mammogram for malignant neoplasm of breast: Secondary | ICD-10-CM | POA: Diagnosis not present

## 2022-08-02 ENCOUNTER — Ambulatory Visit (HOSPITAL_COMMUNITY): Payer: BC Managed Care – PPO | Attending: Internal Medicine

## 2022-08-02 DIAGNOSIS — I351 Nonrheumatic aortic (valve) insufficiency: Secondary | ICD-10-CM | POA: Diagnosis not present

## 2022-08-02 DIAGNOSIS — I712 Thoracic aortic aneurysm, without rupture, unspecified: Secondary | ICD-10-CM | POA: Diagnosis not present

## 2022-08-02 LAB — ECHOCARDIOGRAM COMPLETE
Area-P 1/2: 3.62 cm2
P 1/2 time: 526 msec
S' Lateral: 2.9 cm

## 2022-08-03 ENCOUNTER — Telehealth: Payer: Self-pay

## 2022-08-03 DIAGNOSIS — I351 Nonrheumatic aortic (valve) insufficiency: Secondary | ICD-10-CM

## 2022-08-03 DIAGNOSIS — I712 Thoracic aortic aneurysm, without rupture, unspecified: Secondary | ICD-10-CM

## 2022-08-03 NOTE — Telephone Encounter (Signed)
-----   Message from Christell Constant, MD sent at 08/03/2022 11:52 AM EDT ----- Results: Trivial AI Mild aortic dilation 40 mm Study does not meet family criteria for hypertrophic cardiomyopathy Plan: Echo in one year  Christell Constant, MD

## 2022-08-03 NOTE — Telephone Encounter (Signed)
Order placed for Echo due in 1 yr.

## 2022-08-21 DIAGNOSIS — M1712 Unilateral primary osteoarthritis, left knee: Secondary | ICD-10-CM | POA: Diagnosis not present

## 2022-08-21 DIAGNOSIS — M25561 Pain in right knee: Secondary | ICD-10-CM | POA: Diagnosis not present

## 2022-08-29 ENCOUNTER — Other Ambulatory Visit: Payer: Self-pay | Admitting: Internal Medicine

## 2022-09-11 ENCOUNTER — Ambulatory Visit: Payer: BC Managed Care – PPO | Attending: Internal Medicine | Admitting: Internal Medicine

## 2022-09-11 ENCOUNTER — Encounter: Payer: Self-pay | Admitting: Internal Medicine

## 2022-09-11 VITALS — BP 124/76 | HR 71 | Ht 65.0 in | Wt 252.0 lb

## 2022-09-11 DIAGNOSIS — I77819 Aortic ectasia, unspecified site: Secondary | ICD-10-CM

## 2022-09-11 DIAGNOSIS — Z8249 Family history of ischemic heart disease and other diseases of the circulatory system: Secondary | ICD-10-CM

## 2022-09-11 DIAGNOSIS — I517 Cardiomegaly: Secondary | ICD-10-CM

## 2022-09-11 DIAGNOSIS — I10 Essential (primary) hypertension: Secondary | ICD-10-CM | POA: Diagnosis not present

## 2022-09-11 DIAGNOSIS — I351 Nonrheumatic aortic (valve) insufficiency: Secondary | ICD-10-CM

## 2022-09-11 DIAGNOSIS — I712 Thoracic aortic aneurysm, without rupture, unspecified: Secondary | ICD-10-CM | POA: Diagnosis not present

## 2022-09-11 DIAGNOSIS — E782 Mixed hyperlipidemia: Secondary | ICD-10-CM

## 2022-09-11 NOTE — Progress Notes (Signed)
Cardiology Office Note:    Date:  09/11/2022   ID:  TAKAKO ASP, DOB 1955-04-06, MRN [id number]  PCP:  [name], MD   [facility] Providers Cardiologist:  Christell Constant, MD     Referring MD: [name], MD   CC: AI f/u   History of Present Illness:    Deanna [name] is a 67 y.o. female with a hx of  HTN, Morbid Obesity, Family history of hypertrophic cardiomyopathy (mother, no genetic testing available), who presents for new CP. Former Radio broadcast assistant patient.  2023: Echo showed no LVOT gradient (resting study- could not do stress testing because of hip pain) or thickness suggestive of HCM.  Zero CAC score. 2024: stable echo  Patient notes that she is doing well.   Since last visit notes that she is semi-retired . There are no interval hospital/ED visit.    No chest pain or pressure.  No SOB/DOE and no PND/Orthopnea.  No weight gain or leg swelling.  No palpitations or syncope .   Past Medical History:  Diagnosis Date   Cardiomegaly    Diastasis recti    Edema    Elevated LDL cholesterol level    Hypertension    Hypothyroidism    Joint pain    LVH (left ventricular hypertrophy)    Morbid obesity (HCC)    Myalgia    Myositis    Osteoarthritis    OVERWEIGHT/OBESITY 12/20/2009   Qualifier: Diagnosis of  By: Daleen Squibb, MD, Glade Lloyd C    Palpitations 12/20/2009   Qualifier: Diagnosis of  By: Daleen Squibb, MD, Glade Lloyd C    Rhinitis    Rosacea    Shortness of breath 12/20/2009   Qualifier: Diagnosis of  By: Daleen Squibb, MD, Lorenza Cambridge    Situational stress    Thyroid disease    THYROID DISEASE, HX OF 12/20/2009   Qualifier: Diagnosis of  By: Daleen Squibb, MD, Lorenza Cambridge    Vitamin D deficiency     Past Surgical History:  Procedure Laterality Date   CERVICAL FUSION     CHOLECYSTECTOMY     FOOT SURGERY     HAND SURGERY     TUBAL LIGATION      Current Medications: Current Meds  Medication Sig   albuterol (VENTOLIN HFA) 108 (90 Base) MCG/ACT inhaler as  needed.   hydrochlorothiazide (HYDRODIURIL) 25 MG tablet Take 25 mg by mouth daily.   levothyroxine (SYNTHROID) 137 MCG tablet Take 137 mcg by mouth daily before breakfast.   losartan (COZAAR) 100 MG tablet Take 100 mg by mouth daily.   metoprolol tartrate (LOPRESSOR) 25 MG tablet Take 1 tablet (25 mg total) by mouth 2 (two) times daily.   naproxen (NAPROSYN) 500 MG tablet as needed for moderate pain.   rosuvastatin (CRESTOR) 5 MG tablet Take 1 tablet (5 mg total) by mouth daily. Please keep scheduled appointment for future refills. Thank you.     Allergies:   Cyclobenzaprine and Diclofenac   Social History   Socioeconomic History   Marital status: Married    Spouse name: Not on file   Number of children: Not on file   Years of education: Not on file   Highest education level: Not on file  Occupational History   Not on file  Tobacco Use   Smoking status: Never   Smokeless tobacco: Never  Substance and Sexual Activity   Alcohol use: No   Drug use: Never   Sexual activity: Not on file  Other Topics  Concern   Not on file  Social History Narrative   Not on file   Social Determinants of Health   Financial Resource Strain: Not on file  Food Insecurity: Not on file  Transportation Needs: Not on file  Physical Activity: Not on file  Stress: Not on file  Social Connections: Not on file    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  ROS:   Please see the history of present illness.     EKGs/Labs/Other Studies Reviewed:    The following studies were reviewed today:  EKG:  06/19/21: SR rate  65, LVH  Cardiac Studies & Procedures       ECHOCARDIOGRAM  ECHOCARDIOGRAM COMPLETE 08/02/2022  Narrative ECHOCARDIOGRAM REPORT    Patient Name:   Deanna [name] Date of Exam: 08/02/2022 Medical Rec #:  [id number]        Height:       65.0 in Accession #:    [id number]       Weight:       245.0 lb Date of Birth:  04/28/1955        BSA:          2.156 m Patient Age:    66 years         BP:           142/81 mmHg Patient Gender: F                HR:           74 bpm. Exam Location:  [address]  Procedure: 2D Echo, 3D Echo, Cardiac Doppler and Color Doppler  Indications:    I71.20 Thoracic Aortic Aneurysm  History:        Patient has prior history of Echocardiogram examinations, most recent 06/27/2021. Cardiomegaly, Signs/Symptoms:Shortness of Breath, Chest Pain and Edema; Risk Factors:Family History of Coronary Artery Disease and Hypertension. Palpitations, Obesity, Thoracic Aortic Aneurysm with Aortic Insuficiency.  Sonographer:    Farrel Conners RDCS Referring Phys: [facility] A Sabreen Kitchen  IMPRESSIONS   1. Left ventricular ejection fraction, by estimation, is 65 to 70%. The left ventricle has normal function. The left ventricle has no regional wall motion abnormalities. Left ventricular diastolic parameters were normal. 2. Right ventricular systolic function is normal. The right ventricular size is normal. 3. The mitral valve is normal in structure. Mild mitral valve regurgitation. 4. The aortic valve is normal in structure. Aortic valve regurgitation is mild.  FINDINGS Left Ventricle: Left ventricular ejection fraction, by estimation, is 65 to 70%. The left ventricle has normal function. The left ventricle has no regional wall motion abnormalities. The left ventricular internal cavity size was normal in size. There is no left ventricular hypertrophy. Left ventricular diastolic parameters were normal.  Right Ventricle: The right ventricular size is normal. Right  vetricular wall thickness was not assessed. Right ventricular systolic function is normal.  Left Atrium: Left atrial size was normal in size.  Right Atrium: Right atrial size was normal in size.  Pericardium: There is no evidence of pericardial effusion.  Mitral Valve: The mitral valve is normal in structure. Mild mitral valve regurgitation.  Tricuspid Valve: The tricuspid valve is normal in structure. Tricuspid valve regurgitation is trivial.  Aortic Valve: The aortic valve is normal in structure. Aortic valve regurgitation is mild. Aortic regurgitation PHT measures 526 msec.  Pulmonic Valve: The pulmonic valve was normal in structure. Pulmonic valve regurgitation is trivial.  Aorta: The aortic root and ascending aorta are structurally normal, with no evidence of dilitation.  IAS/Shunts: No atrial level shunt detected by color flow Doppler.   LEFT VENTRICLE PLAX 2D LVIDd:         4.70 cm   Diastology LVIDs:         2.90 cm   LV e' medial:    13.30 cm/s LV PW:         1.00 cm   LV E/e' medial:  8.0 LV IVS:        1.00 cm   LV e' lateral:   12.10 cm/s LVOT diam:     2.10 cm   LV E/e' lateral: 8.8 LV SV:         99 LV SV Index:   46 LVOT Area:     3.46 cm  3D Volume EF: 3D EF:        64 % LV EDV:       104 ml LV ESV:       38 ml LV SV:        67 ml  RIGHT VENTRICLE RV Basal diam:  3.40 cm RV S prime:     15.70 cm/s TAPSE (M-mode): 2.6 cm RVSP:           26.8 mmHg  LEFT ATRIUM             Index        RIGHT ATRIUM           Index LA diam:        4.30 cm 1.99 cm/m   RA Pressure: 3.00 mmHg LA Vol (A2C):   66.4 ml 30.79 ml/m  RA Area:     15.30 cm LA Vol (A4C):   65.8 ml 30.51 ml/m  RA Volume:   37.70 ml  17.48 ml/m LA Biplane Vol: 68.7 ml 31.86 ml/m AORTIC VALVE LVOT Vmax:   128.00 cm/s LVOT Vmean:  87.550 cm/s LVOT VTI:    0.286 m AI PHT:      526 msec  AORTA Ao Root diam: 3.60 cm Ao Asc diam:  4.00 cm  MITRAL VALVE                TRICUSPID VALVE MV Area  (PHT): cm          TR Peak grad:   23.8 mmHg MV Decel Time: 210 msec     TR Vmax:        244.00 cm/s MV E velocity: 106.50 cm/s  Estimated RAP:  3.00 mmHg MV A velocity: 85.15 cm/s   RVSP:           26.8 mmHg MV E/A ratio:  1.25 SHUNTS Systemic VTI:  0.29 m Systemic Diam: 2.10 cm  Dietrich Pates MD Electronically signed by Dietrich Pates MD Signature Date/Time: 08/02/2022/8:30:36 PM    Final    MONITORS  LONG TERM MONITOR (3-14 DAYS) 07/23/2021  Narrative  Patient had a minimum heart rate of 48 bpm, maximum heart rate of 129 bpm, and average heart rate of 72 bpm.  Predominant underlying rhythm was sinus rhythm.  Two short runs of P-SVT.  Isolated PACs were rare (<1.0%).  Isolated  PVCs were rare (<1.0%).  No triggered and diary events.  No malignant arrhythmias.   CT SCANS  CT CARDIAC SCORING (SELF PAY ONLY) 10/12/2021  Addendum 10/12/2021  8:21 AM ADDENDUM REPORT: 10/12/2021 08:18  EXAM: OVER-READ INTERPRETATION  CT CHEST  The following report is a limited chest CT over-read performed by radiologist Dr. Irma Newness [facility] on 10/12/2021. This over-read does not include interpretation of cardiac or coronary anatomy or pathology. The coronary calcium score interpretation by the cardiologist is attached.  COMPARISON:  Chest radiographs 04/27/2008.  FINDINGS: Mediastinum/Nodes: No enlarged lymph nodes within the visualized mediastinum.  Lungs/Pleura: There is no pleural effusion. The visualized lungs appear clear.  Upper abdomen: No significant findings in the visualized upper abdomen.  Musculoskeletal/Chest wall: No chest wall mass or suspicious osseous findings within the visualized chest. Multilevel thoracic spondylosis.  IMPRESSION: No significant extracardiac findings within the visualized chest.   Electronically Signed By: Carey Bullocks M.D. On: 10/12/2021 08:18  Narrative CLINICAL DATA:  Risk stratification  EXAM: Coronary  Calcium Score  TECHNIQUE: The patient was scanned on a Siemens Somatom 64 slice scanner. Axial non-contrast 3mm slices were carried out through the heart. The data set was analyzed on a dedicated work station and scored using the Agatson method.  FINDINGS: Non-cardiac: No significant non cardiac findings on limited lung and soft tissue windows. See separate report from [facility].  Ascending Aorta: Mildly dilated 3.9 cm  Pericardium: Normal  Coronary arteries: No calcium noted  IMPRESSION: Coronary calcium score of 0.  Charlton Haws  Electronically Signed: By: Charlton Haws M.D. On: 10/11/2021 17:02           Recent Labs: 12/22/2021: ALT 15  Recent Lipid Panel    Component Value Date/Time   CHOL 139 12/22/2021 1327   CHOL 171 09/01/2021 1018   TRIG 111 12/22/2021 1327   HDL 52 12/22/2021 1327   HDL 49 09/01/2021 1018   CHOLHDL 2.7 12/22/2021 1327   LDLCALC 68 12/22/2021 1327        Physical Exam:    VS:  BP 124/76   Pulse 71   Ht 5\' 5"  (1.651 m)   Wt 252 lb (114.3 kg)   SpO2 95%   BMI 41.93 kg/m     Wt Readings from Last 3 Encounters:  09/11/22 252 lb (114.3 kg)  09/01/21 245 lb (111.1 kg)  06/19/21 237 lb (107.5 kg)    Gen: No distress, morbid obesity Neck: No JVD Cardiac: No Rubs or Gallops, soft diastolic murmur, RRR +2 radial pulses Respiratory: Clear to auscultation bilaterally, normal effort, normal  respiratory rate GI: Soft, nontender, non-distended, no abdominal bruit or pulsation MS: No edema;  moves all extremities Integument: Skin feels warm Neuro:  At time of evaluation, alert and oriented to person/place/time/situation  Psych: Normal affect, patient feels well   ASSESSMENT:    1. Nonrheumatic aortic valve insufficiency   2. Thoracic aortic aneurysm without rupture, unspecified part (HCC)   3. LVH (left ventricular hypertrophy)   4. Essential hypertension   5. Family history of hypertrophic cardiomyopathy   6. Aortic  valve regurgitation due to aortic dilation (HCC)   7. Mixed hyperlipidemia   8. Morbid obesity (HCC)     PLAN:    HLD - zero calcium score - continue rosuvastatin 5 mg   HTN and Morbid Obesity - Amb BP monitoring: not done  - Losartan 100 mg, metoprolol 25 mg PO BID  - starting exercise in July after going  to part time - husband is not home a lot because of a truck driver; so she tends to cook less  Diet Prescription Type: Vegan Calorie goal: 1800 Weight loss goal if applicable: 1 lb a week Limitations: not an "adventurous eater," she tends not to cook for her self when husband isn't around; his CAC score is 400+ Meal plan: Created one day meal plan and gave online resources for weekly planning; patient is amenable to diet created   Mild AI Mild aortic dilation  Family history of HCM - reviewed Echo from 2024; Echo in one year  One year      Medication Adjustments/Labs and Tests Ordered: Current medicines are reviewed at length with the patient today.  Concerns regarding medicines are outlined above.  Orders Placed This Encounter  Procedures   EKG 12-Lead   No orders of the defined types were placed in this encounter.   Patient Instructions  Medication Instructions:  Your physician recommends that you continue on your current medications as directed. Please refer to the Current Medication list given to you today.  *If you need a refill on your cardiac medications before your next appointment, please call your pharmacy*   Lab Work: NONE If you have labs (blood work) drawn today and your tests are completely normal, you will receive your results only by: MyChart Message (if you have MyChart) OR A paper copy in the mail If you have any lab test that is abnormal or we need to change your treatment, we will call you to review the results.   Testing/Procedures: MAY 2025: Your physician has requested that you have an echocardiogram. Echocardiography is a painless  test that uses sound waves to create images of your heart. It provides your doctor with information about the size and shape of your heart and how well your heart's chambers and valves are working. This procedure takes approximately one hour. There are no restrictions for this procedure. Please do NOT wear cologne, perfume, aftershave, or lotions (deodorant is allowed). Please arrive 15 minutes prior to your appointment time.    Follow-Up: At [facility], you and your health needs are our priority.  As part of our continuing mission to provide you with exceptional heart care, we have created designated Provider Care Teams.  These Care Teams include your primary Cardiologist (physician) and Advanced Practice Providers (APPs -  Physician Assistants and Nurse Practitioners) who all work together to provide you with the care you need, when you need it.  We recommend signing up for the patient portal called "MyChart".  Sign up information is provided on this After Visit Summary.  MyChart is used to connect with patients for Virtual Visits (Telemedicine).  Patients are able to view lab/test results, encounter notes, upcoming appointments, etc.  Non-urgent messages can be sent to your provider as well.   To learn more about what you can do with MyChart, go to [email or web address].    Your next appointment:   1 year(s)  Provider:   Riley Lam, MD       Signed, Christell Constant, MD  09/11/2022 4:58 PM    [facility]

## 2022-09-11 NOTE — Patient Instructions (Signed)
Medication Instructions:  Your physician recommends that you continue on your current medications as directed. Please refer to the Current Medication list given to you today.  *If you need a refill on your cardiac medications before your next appointment, please call your pharmacy*   Lab Work: NONE If you have labs (blood work) drawn today and your tests are completely normal, you will receive your results only by: MyChart Message (if you have MyChart) OR A paper copy in the mail If you have any lab test that is abnormal or we need to change your treatment, we will call you to review the results.   Testing/Procedures: MAY 2025: Your physician has requested that you have an echocardiogram. Echocardiography is a painless test that uses sound waves to create images of your heart. It provides your doctor with information about the size and shape of your heart and how well your heart's chambers and valves are working. This procedure takes approximately one hour. There are no restrictions for this procedure. Please do NOT wear cologne, perfume, aftershave, or lotions (deodorant is allowed). Please arrive 15 minutes prior to your appointment time.    Follow-Up: At [facility], you and your health needs are our priority.  As part of our continuing mission to provide you with exceptional heart care, we have created designated Provider Care Teams.  These Care Teams include your primary Cardiologist (physician) and Advanced Practice Providers (APPs -  Physician Assistants and Nurse Practitioners) who all work together to provide you with the care you need, when you need it.  We recommend signing up for the patient portal called "MyChart".  Sign up information is provided on this After Visit Summary.  MyChart is used to connect with patients for Virtual Visits (Telemedicine).  Patients are able to view lab/test results, encounter notes, upcoming appointments, etc.  Non-urgent messages can be  sent to your provider as well.   To learn more about what you can do with MyChart, go to [email or web address].    Your next appointment:   1 year(s)  Provider:   Riley Lam, MD

## 2022-09-14 DIAGNOSIS — M25562 Pain in left knee: Secondary | ICD-10-CM | POA: Diagnosis not present

## 2022-09-14 DIAGNOSIS — M25561 Pain in right knee: Secondary | ICD-10-CM | POA: Diagnosis not present

## 2022-09-21 ENCOUNTER — Other Ambulatory Visit: Payer: Self-pay | Admitting: Internal Medicine

## 2022-09-21 DIAGNOSIS — M25562 Pain in left knee: Secondary | ICD-10-CM | POA: Diagnosis not present

## 2022-09-21 DIAGNOSIS — M25561 Pain in right knee: Secondary | ICD-10-CM | POA: Diagnosis not present

## 2022-09-25 DIAGNOSIS — S83241D Other tear of medial meniscus, current injury, right knee, subsequent encounter: Secondary | ICD-10-CM | POA: Diagnosis not present

## 2022-09-28 DIAGNOSIS — M25562 Pain in left knee: Secondary | ICD-10-CM | POA: Diagnosis not present

## 2022-09-28 DIAGNOSIS — M25561 Pain in right knee: Secondary | ICD-10-CM | POA: Diagnosis not present

## 2022-10-05 DIAGNOSIS — M25561 Pain in right knee: Secondary | ICD-10-CM | POA: Diagnosis not present

## 2022-10-05 DIAGNOSIS — M25562 Pain in left knee: Secondary | ICD-10-CM | POA: Diagnosis not present

## 2022-10-19 DIAGNOSIS — M25561 Pain in right knee: Secondary | ICD-10-CM | POA: Diagnosis not present

## 2022-10-19 DIAGNOSIS — M25562 Pain in left knee: Secondary | ICD-10-CM | POA: Diagnosis not present

## 2022-10-30 DIAGNOSIS — M1712 Unilateral primary osteoarthritis, left knee: Secondary | ICD-10-CM | POA: Diagnosis not present

## 2022-11-07 DIAGNOSIS — M1712 Unilateral primary osteoarthritis, left knee: Secondary | ICD-10-CM | POA: Diagnosis not present

## 2022-11-13 DIAGNOSIS — M1712 Unilateral primary osteoarthritis, left knee: Secondary | ICD-10-CM | POA: Diagnosis not present

## 2022-11-19 DIAGNOSIS — Z23 Encounter for immunization: Secondary | ICD-10-CM | POA: Diagnosis not present

## 2022-11-19 DIAGNOSIS — E039 Hypothyroidism, unspecified: Secondary | ICD-10-CM | POA: Diagnosis not present

## 2022-11-19 DIAGNOSIS — E559 Vitamin D deficiency, unspecified: Secondary | ICD-10-CM | POA: Diagnosis not present

## 2022-11-19 DIAGNOSIS — Z0000 Encounter for general adult medical examination without abnormal findings: Secondary | ICD-10-CM | POA: Diagnosis not present

## 2022-11-19 DIAGNOSIS — E7800 Pure hypercholesterolemia, unspecified: Secondary | ICD-10-CM | POA: Diagnosis not present

## 2022-11-19 DIAGNOSIS — I10 Essential (primary) hypertension: Secondary | ICD-10-CM | POA: Diagnosis not present

## 2023-01-01 DIAGNOSIS — E039 Hypothyroidism, unspecified: Secondary | ICD-10-CM | POA: Diagnosis not present

## 2023-01-16 ENCOUNTER — Other Ambulatory Visit: Payer: Self-pay | Admitting: Internal Medicine

## 2023-02-25 DIAGNOSIS — Z23 Encounter for immunization: Secondary | ICD-10-CM | POA: Diagnosis not present

## 2023-07-25 ENCOUNTER — Ambulatory Visit (HOSPITAL_COMMUNITY): Payer: BC Managed Care – PPO | Attending: Cardiology

## 2023-07-25 DIAGNOSIS — I712 Thoracic aortic aneurysm, without rupture, unspecified: Secondary | ICD-10-CM | POA: Diagnosis present

## 2023-07-25 DIAGNOSIS — I351 Nonrheumatic aortic (valve) insufficiency: Secondary | ICD-10-CM | POA: Diagnosis not present

## 2023-07-25 LAB — ECHOCARDIOGRAM COMPLETE
Area-P 1/2: 3.45 cm2
P 1/2 time: 548 ms
S' Lateral: 3 cm

## 2023-09-02 ENCOUNTER — Other Ambulatory Visit: Payer: Self-pay | Admitting: Internal Medicine

## 2023-09-08 ENCOUNTER — Other Ambulatory Visit: Payer: Self-pay | Admitting: Internal Medicine

## 2023-09-13 ENCOUNTER — Other Ambulatory Visit: Payer: Self-pay | Admitting: Internal Medicine

## 2023-10-10 ENCOUNTER — Encounter: Payer: Self-pay | Admitting: Internal Medicine

## 2023-10-19 ENCOUNTER — Other Ambulatory Visit: Payer: Self-pay | Admitting: Internal Medicine

## 2023-11-18 ENCOUNTER — Ambulatory Visit (INDEPENDENT_AMBULATORY_CARE_PROVIDER_SITE_OTHER)

## 2023-11-18 ENCOUNTER — Encounter: Payer: Self-pay | Admitting: Podiatry

## 2023-11-18 ENCOUNTER — Ambulatory Visit (INDEPENDENT_AMBULATORY_CARE_PROVIDER_SITE_OTHER): Admitting: Podiatry

## 2023-11-18 VITALS — Ht 65.0 in | Wt 250.0 lb

## 2023-11-18 DIAGNOSIS — M7752 Other enthesopathy of left foot: Secondary | ICD-10-CM | POA: Diagnosis not present

## 2023-11-18 DIAGNOSIS — S9032XA Contusion of left foot, initial encounter: Secondary | ICD-10-CM

## 2023-11-18 MED ORDER — TRIAMCINOLONE ACETONIDE 10 MG/ML IJ SUSP
10.0000 mg | Freq: Once | INTRAMUSCULAR | Status: AC
  Administered 2023-11-18: 10 mg via INTRA_ARTICULAR

## 2023-11-18 NOTE — Progress Notes (Signed)
 Subjective:   Patient ID: Deanna [name], female   DOB: 68 y.o.   MRN: [id number]   HPI Patient presents stating she felt a pop in her left big toe joint a couple weeks ago and it has been sore and hard to walk on.  Does not remember a specific injury but she may have walked wrong on and patient does not smoke and likes to be active   Review of Systems  All other systems reviewed and are negative.       Objective:  Physical Exam Vitals and nursing note reviewed.  Constitutional:      Appearance: She is well-developed.  Pulmonary:     Effort: Pulmonary effort is normal.  Musculoskeletal:        General: Normal range of motion.  Skin:    General: Skin is warm.  Neurological:     Mental Status: She is alert.     Neurovascular status intact with muscle strength adequate range of motion within normal limits with patient noted to have discomfort around the first MPJ left foot fluid buildup around the joint surface and pain with pressure.  Patient is noted to have good digital perfusion well oriented x 3 with no indication of tendon dysfunction associated with condition.     Assessment:  Inflammatory capsulitis first MPJ left with trauma that occurred secondary to injury but no indication of tear of the tendon     Plan:  H&P x-ray reviewed sterile prep to the left first MPJ and then did periarticular injection around the joint surface 3 mg dexamethasone Kenalog  5 mg Xylocaine applied sterile dressing advised on rigid bottom shoes reappoint if symptoms indicate  X-rays were negative for signs of there is a fracture associated with this there appears to be a soft tissue inflammatory condition with mild arthritis of the joint

## 2023-12-03 ENCOUNTER — Encounter: Payer: Self-pay | Admitting: Podiatry

## 2023-12-11 ENCOUNTER — Other Ambulatory Visit: Payer: Self-pay | Admitting: Internal Medicine

## 2023-12-15 ENCOUNTER — Other Ambulatory Visit: Payer: Self-pay | Admitting: Internal Medicine

## 2023-12-18 ENCOUNTER — Other Ambulatory Visit: Payer: Self-pay | Admitting: Internal Medicine

## 2023-12-18 MED ORDER — ROSUVASTATIN CALCIUM 5 MG PO TABS: 5.0000 mg | ORAL_TABLET | Freq: Every day | ORAL | 1 refills | Status: AC

## 2023-12-20 ENCOUNTER — Ambulatory Visit (INDEPENDENT_AMBULATORY_CARE_PROVIDER_SITE_OTHER): Admitting: Podiatry

## 2023-12-20 ENCOUNTER — Encounter: Payer: Self-pay | Admitting: Podiatry

## 2023-12-20 DIAGNOSIS — M7752 Other enthesopathy of left foot: Secondary | ICD-10-CM | POA: Diagnosis not present

## 2023-12-20 MED ORDER — TRIAMCINOLONE ACETONIDE 10 MG/ML IJ SUSP
10.0000 mg | Freq: Once | INTRAMUSCULAR | Status: AC
  Administered 2023-12-20: 10 mg via INTRA_ARTICULAR

## 2023-12-23 NOTE — Progress Notes (Signed)
 Subjective:   Patient ID: Deanna [name], female   DOB: 68 y.o.   MRN: [id number]   HPI Patient presents stating the big toe joint is feeling a lot better but she is getting some pain in the left ankle and was concerned    ROS      Objective:  Physical Exam  Neurovascular status intact for foot pain improved with minimal discomfort around the first MPJ.  Patient is noted to have significant discomfort in the sinus tarsi left with fluid buildup noted within the joint     Assessment:  Inflammatory capsulitis of the sinus tarsi left with fluid buildup within the joint surface     Plan:  Reviewed this is probably compensatory and working to try to just get it better at this time and I went ahead did sterile prep and injected the sinus tarsi left 3 mg Kenalog  5 mg Xylocaine applied sterile dressing and gave instructions for reduced activity.  Reappoint as symptoms indicate

## 2024-01-15 ENCOUNTER — Other Ambulatory Visit: Payer: Self-pay | Admitting: Internal Medicine

## 2024-01-17 ENCOUNTER — Ambulatory Visit: Admitting: Internal Medicine

## 2024-02-17 ENCOUNTER — Encounter (HOSPITAL_BASED_OUTPATIENT_CLINIC_OR_DEPARTMENT_OTHER): Payer: Self-pay

## 2024-02-18 ENCOUNTER — Ambulatory Visit: Attending: Internal Medicine | Admitting: Internal Medicine

## 2024-02-18 VITALS — BP 154/89 | HR 71 | Ht 64.0 in | Wt 254.0 lb

## 2024-02-18 DIAGNOSIS — E782 Mixed hyperlipidemia: Secondary | ICD-10-CM | POA: Diagnosis not present

## 2024-02-18 DIAGNOSIS — I517 Cardiomegaly: Secondary | ICD-10-CM

## 2024-02-18 DIAGNOSIS — I10 Essential (primary) hypertension: Secondary | ICD-10-CM

## 2024-02-18 DIAGNOSIS — I351 Nonrheumatic aortic (valve) insufficiency: Secondary | ICD-10-CM | POA: Diagnosis not present

## 2024-02-18 DIAGNOSIS — I712 Thoracic aortic aneurysm, without rupture, unspecified: Secondary | ICD-10-CM

## 2024-02-18 DIAGNOSIS — I77819 Aortic ectasia, unspecified site: Secondary | ICD-10-CM

## 2024-02-18 NOTE — Progress Notes (Signed)
 Cardiology Office Note:    Date:  02/18/2024   ID:  Deanna [name], DOB 1956-02-17, MRN [id number]  PCP:  Seabron Lenis, MD   [facility] Providers Cardiologist:  Stanly DELENA Leavens, MD     Referring MD: Seabron Lenis, MD   CC: Aortic regurgitation  History of Present Illness:    Deanna [name] is a 68 y.o. female with a hx of  HTN, Morbid Obesity, Family history of hypertrophic cardiomyopathy (mother, no genetic testing available), who presents for new CP. Former [employer] patient.  2023: Echo showed no LVOT gradient (resting study- could not do stress testing because of hip pain) or thickness suggestive of HCM.  Zero CAC score. 2024: stable echo  Deanna [name] is a  is a 68 year old female with hypertension and mild aortic regurgitation who presents for a follow-up visit.  She has a history of hypertension with home blood pressure readings usually below 140/90 mmHg, although she has been elevated during office visits. The blood pressure cuff causes her pain, which may affect the readings. At home, her blood pressure has been as low as 120 mmHg systolic.  She has mild aortic regurgitation and mild aortic dilation, both of which have been stable over the past three years. Echocardiograms from 2023, 2024, and 2025 show no significant changes. She reports only occasional shortness of breath when walking up stairs, which she considers normal.  She transitioned to a vegan diet at her last visit and is currently on rosuvastatin  for cholesterol management. Her cholesterol levels have improved, and she had a calcium  score of zero with normal strain imaging.  There is a family history of hypertrophic cardiomyopathy, but she has not shown evidence of this condition on echocardiograms. Genetic testing was not available, so screening was done via echocardiogram.   Past Medical History:  Diagnosis Date   Cardiomegaly    Diastasis recti    Edema    Elevated LDL cholesterol level     Hypertension    Hypothyroidism    Joint pain    LVH (left ventricular hypertrophy)    Morbid obesity (HCC)    Myalgia    Myositis    Osteoarthritis    OVERWEIGHT/OBESITY 12/20/2009   Qualifier: Diagnosis of  By: Edith, MD, CODY Ned C    Palpitations 12/20/2009   Qualifier: Diagnosis of  By: Edith, MD, CODY Ned C    Rhinitis    Rosacea    Shortness of breath 12/20/2009   Qualifier: Diagnosis of  By: Edith, MD, CODY Ned JAYSON    Situational stress    Thyroid  disease    THYROID  DISEASE, HX OF 12/20/2009   Qualifier: Diagnosis of  By: Edith, MD, CODY Ned JAYSON    Vitamin D deficiency     Past Surgical History:  Procedure Laterality Date   CERVICAL FUSION     CHOLECYSTECTOMY     FOOT SURGERY     HAND SURGERY     TUBAL LIGATION      Current Medications: Current Meds  Medication Sig   albuterol  (VENTOLIN  HFA) 108 (90 Base) MCG/ACT inhaler as needed.   hydrochlorothiazide (HYDRODIURIL) 25 MG tablet Take 25 mg by mouth daily.   levothyroxine (SYNTHROID) 125 MCG tablet daily before breakfast.   losartan (COZAAR) 100 MG tablet Take 100 mg by mouth daily.   metoprolol  tartrate (LOPRESSOR ) 25 MG tablet Take 0.5 tablets (12.5 mg total) by mouth 2 (two) times daily.   rosuvastatin  (CRESTOR ) 5 MG tablet Take 1 tablet (  5 mg total) by mouth daily.   tirzepatide (ZEPBOUND) 2.5 MG/0.5ML injection vial once a week.     Allergies:   Cyclobenzaprine and Diclofenac   Social History   Socioeconomic History   Marital status: Married    Spouse name: Not on file   Number of children: Not on file   Years of education: Not on file   Highest education level: Not on file  Occupational History   Not on file  Tobacco Use   Smoking status: Never   Smokeless tobacco: Never  Substance and Sexual Activity   Alcohol use: No   Drug use: Never   Sexual activity: Not on file  Other Topics Concern   Not on file  Social History Narrative   Not on file   Social Drivers of Health    Financial Resource Strain: Not on file  Food Insecurity: Not on file  Transportation Needs: Not on file  Physical Activity: Not on file  Stress: Not on file  Social Connections: Not on file    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  ROS:   Please see the history of present illness.     EKGs/Labs/Other Studies Reviewed:    The following studies were reviewed today:  EKG:  06/19/21: SR rate 65, LVH  Cardiac Studies & Procedures   ______________________________________________________________________________________________     ECHOCARDIOGRAM  ECHOCARDIOGRAM COMPLETE 07/25/2023  Narrative ECHOCARDIOGRAM REPORT    Patient Name:   Deanna [name] Date of Exam: 07/25/2023 Medical Rec #:  [id number]        Height:       65.0 in Accession #:    [id number]       Weight:       252.0 lb Date of Birth:  22-Jan-1956        BSA:          2.182 m Patient Age:    17 years         BP:           124/76 mmHg Patient Gender: F                HR:           69 bpm. Exam Location:  [address]  Procedure: 2D Echo, Cardiac Doppler, Color Doppler, 3D Echo and Strain Analysis (Both Spectral and Color Flow Doppler were utilized during procedure).  Indications:    I35.1 AI  History:        Patient has prior history of Echocardiogram examinations, most recent 08/02/2022. LVH, AI, Signs/Symptoms:Dilated aorta; Risk Factors:Hypertension, Dyslipidemia and Morbid obesity.  Sonographer:    Elsie Bohr RDCS Referring Phys: [id number] [facility] A Shante [place]  IMPRESSIONS   1. Left ventricular ejection  fraction, by estimation, is 60 to 65%. Left ventricular ejection fraction by 3D volume is 65 %. The left ventricle has normal function. The left ventricle has no regional wall motion abnormalities. Left ventricular diastolic parameters were normal. The average left ventricular global longitudinal strain is -22.5 %. The global longitudinal strain is normal. 2. Right ventricular systolic function is normal. The right ventricular size is normal. 3. The  mitral valve is grossly normal. No evidence of mitral valve regurgitation. No evidence of mitral stenosis. 4. The aortic valve was not well visualized. Aortic valve regurgitation is mild. No aortic stenosis is present. 5. The inferior vena cava is normal in size with greater than 50% respiratory variability, suggesting right atrial pressure of 3 mmHg. 6. Ascending aorta measurements are within normal limits for age when indexed to body surface area.  FINDINGS Left Ventricle: Left ventricular ejection fraction, by estimation, is 60 to 65%. Left ventricular ejection fraction by 3D volume is 65 %. The left ventricle has normal function. The left ventricle has no regional wall motion abnormalities. The average left ventricular global longitudinal strain is -22.5 %. Strain was performed and the global longitudinal strain is normal. The left ventricular internal cavity size was normal in size. There is no left ventricular hypertrophy. Left ventricular diastolic parameters were normal.  Right Ventricle: The right ventricular size is normal. No increase in right ventricular wall thickness. Right ventricular systolic function is normal.  Left Atrium: Left atrial size was normal in size.  Right Atrium: Right atrial size was normal in size.  Pericardium: There is no evidence of pericardial effusion.  Mitral Valve: The mitral valve is grossly normal. No evidence of mitral valve regurgitation. No evidence of mitral valve stenosis.  Tricuspid Valve: The  tricuspid valve is not well visualized. Tricuspid valve regurgitation is not demonstrated. No evidence of tricuspid stenosis.  Aortic Valve: The aortic valve was not well visualized. Aortic valve regurgitation is mild. Aortic regurgitation PHT measures 548 msec. No aortic stenosis is present.  Pulmonic Valve: The pulmonic valve was not well visualized. Pulmonic valve regurgitation is not visualized. No evidence of pulmonic stenosis.  Aorta: The aortic root and ascending aorta are structurally normal, with no evidence of dilitation. Ascending aorta measurements are within normal limits for age when indexed to body surface area.  Venous: The inferior vena cava is normal in size with greater than 50% respiratory variability, suggesting right atrial pressure of 3 mmHg.  IAS/Shunts: The atrial septum is grossly normal.  Additional Comments: 3D was performed not requiring image post processing on an independent workstation and was normal.   LEFT VENTRICLE PLAX 2D LVIDd:         4.70 cm         Diastology LVIDs:         3.00 cm         LV e' medial:    9.67 cm/s LV PW:         0.70 cm         LV E/e' medial:  11.1 LV IVS:        0.80 cm         LV e' lateral:   12.70 cm/s LVOT diam:     2.10 cm         LV E/e' lateral: 8.4 LV SV:         100 LV SV Index:   46              2D Longitudinal LVOT Area:     3.46 cm        Strain 2D Strain GLS   -21.0 % (A4C): 2D Strain GLS   -24.4 % (A3C): 2D Strain GLS   -22.0 % (A2C): 2D Strain GLS   -22.5 % Avg:  3D Volume EF LV 3D EF:    Left ventricul ar ejection fraction by 3D volume is 65 %.  3D Volume EF: 3D EF:        65 % LV EDV:       122 ml LV ESV:       43 ml LV SV:        79 ml  RIGHT VENTRICLE             IVC RV S prime:     15.30 cm/s  IVC diam: 1.80 cm TAPSE (M-mode): 2.7 cm  LEFT ATRIUM             Index        RIGHT ATRIUM           Index LA diam:        4.00 cm 1.83 cm/m   RA Pressure: 3.00 mmHg LA Vol (A2C):   61.8  ml 28.32 ml/m  RA Area:     13.30 cm LA Vol (A4C):   58.2 ml 26.67 ml/m  RA Volume:   32.20 ml  14.76 ml/m LA Biplane Vol: 65.5 ml 30.01 ml/m AORTIC VALVE LVOT Vmax:   131.00 cm/s LVOT Vmean:  87.900 cm/s LVOT VTI:    0.288 m AI PHT:      548 msec  AORTA Ao Root diam: 3.80 cm Ao Asc diam:  3.90 cm  MITRAL VALVE                TRICUSPID VALVE MV Area (PHT): 3.45 cm     Estimated RAP:  3.00 mmHg MV Decel Time: 220 msec MV E velocity: 107.00 cm/s  SHUNTS MV A velocity: 93.30 cm/s   Systemic VTI:  0.29 m MV E/A ratio:  1.15         Systemic Diam: 2.10 cm  Sunit Tolia Electronically signed by Madonna Large Signature Date/Time: 07/25/2023/12:09:53 PM    Final    MONITORS  LONG TERM MONITOR (3-14 DAYS) 07/18/2021  Narrative  Patient had a minimum heart rate of 48 bpm, maximum heart rate of 129 bpm, and average heart rate of 72 bpm.  Predominant underlying rhythm was sinus rhythm.  Two short runs of P-SVT.  Isolated PACs were rare (<1.0%).  Isolated PVCs were rare (<1.0%).  No triggered and diary events.  No malignant arrhythmias.   CT SCANS  CT CARDIAC SCORING (SELF PAY ONLY) 10/11/2021  Addendum 10/12/2021  8:21 AM ADDENDUM REPORT: 10/12/2021 08:18  EXAM: OVER-READ INTERPRETATION  CT CHEST  The following report is a limited chest CT over-read performed by radiologist Dr. Elsie Ko [facility] on 10/12/2021. This over-read does not include interpretation of cardiac or coronary anatomy or pathology. The coronary calcium  score interpretation by the cardiologist is attached.  COMPARISON:  Chest radiographs 04/27/2008.  FINDINGS: Mediastinum/Nodes: No enlarged lymph nodes within the visualized mediastinum.  Lungs/Pleura: There is no pleural effusion. The visualized lungs appear clear.  Upper abdomen: No significant findings in the visualized upper abdomen.  Musculoskeletal/Chest wall: No chest wall mass or suspicious  osseous findings within the visualized chest. Multilevel thoracic spondylosis.  IMPRESSION: No significant extracardiac findings within the visualized chest.   Electronically Signed By: Elsie Perone M.D. On: 10/12/2021 08:18  Narrative CLINICAL DATA:  Risk stratification  EXAM: Coronary Calcium  Score  TECHNIQUE: The patient was scanned on a Siemens Somatom 64 slice scanner. Axial non-contrast 3mm slices were carried out through the heart. The data set was analyzed on a dedicated work station and scored using the Agatson method.  FINDINGS: Non-cardiac: No significant non cardiac findings on limited lung and  soft tissue windows. See separate report from [facility].  Ascending Aorta: Mildly dilated 3.9 cm  Pericardium: Normal  Coronary arteries: No calcium  noted  IMPRESSION: Coronary calcium  score of 0.  Maude Emmer  Electronically Signed: By: Maude Emmer M.D. On: 10/11/2021 17:02     ______________________________________________________________________________________________       Recent Labs: No results found for requested labs within last 365 days.  Recent Lipid Panel    Component Value Date/Time   CHOL 139 12/22/2021 1327   CHOL 171 09/01/2021 1018   TRIG 111 12/22/2021 1327   HDL 52 12/22/2021 1327   HDL 49 09/01/2021 1018   CHOLHDL 2.7 12/22/2021 1327   LDLCALC 68 12/22/2021 1327        Physical Exam:    VS:  BP (!) 154/89   Pulse 71   Ht 5' 4 (1.626 m)   Wt 254 lb (115.2 kg)   SpO2 95%   BMI 43.60 kg/m     Wt Readings from Last 3 Encounters:  02/18/24 254 lb (115.2 kg)  11/18/23 250 lb (113.4 kg)  09/11/22 252 lb (114.3 kg)    Gen: No distress, morbid obesity Neck: No JVD Cardiac: No Rubs or Gallops, soft diastolic murmur, RRR +2 radial pulses Respiratory: Clear to auscultation bilaterally, normal effort, normal  respiratory rate GI: Soft, nontender, non-distended, no abdominal bruit or pulsation MS: No edema;   moves all extremities Integument: Skin feels warm Neuro:  At time of evaluation, alert and oriented to person/place/time/situation  Psych: Normal affect, patient feels well   ASSESSMENT/PLAN:    Mild aortic root dilation and mild aortic regurgitation Family history of HCM with no evidence on her imaging Well-managed for at least three years. No evidence of severe aortopathy or significant valve dysfunction. No symptoms suggestive of congestive heart failure or severe valve disease. Shortness of breath likely related to deconditioning rather than cardiac pathology. - Continue monitoring with echocardiograms as needed, potentially extending intervals due to stability  - Educated on symptoms of worsening condition, such as significant shortness of breath or fatigue, which would warrant further evaluation.  Essential hypertension Blood pressure is elevated in the office, likely due to white coat hypertension. Home readings are generally within normal range. Mild aortic dilation and regurgitation necessitate careful management to prevent complications such as stroke. - Continue to monitor blood pressure at home regularly. - Will consider medication adjustment if home readings remain elevated (Change to chlorthalidone)  Morbid obesity Contributing to elevated blood pressure and potential cardiovascular risk. Discussed potential benefits of GLP-1 receptor agonists (GLP-1RAs) for weight loss and blood pressure reduction. Potential side effects include slowed gastric emptying, requiring attention to hydration, small meals, and fiber intake. Strength training recommended to preserve muscle mass during weight loss. - Initiated GLP-1RA therapy for weight loss, pending pharmacy availability. - Encouraged strength-based training to preserve muscle mass. - Advised on dietary modifications to support weight loss and manage side effects of GLP-1RAs.  Mixed hyperlipidemia Cholesterol levels are  well-controlled with rosuvastatin . Previous calcium  score was zero, indicating low coronary artery disease risk. - Continue rosuvastatin  therapy.  Longitudinal care: The evaluation and management services provided today reflect the complexity inherent in caring for this patient, including the ongoing longitudinal relationship and management of multiple chronic conditions and/or the need for care coordination. The visit required a comprehensive assessment and management plan tailored to the patient's unique needs Time was spent addressing not only the acute concerns but also the broader context of the patient's health,  including preventive care, chronic disease management, and care coordination as appropriate.  Complex longitudinal is necessary for conditions including: Family HCM screening with AI f/u

## 2024-02-18 NOTE — Patient Instructions (Signed)
 Medication Instructions:  NO CHANGES  *If you need a refill on your cardiac medications before your next appointment, please call your pharmacy*  Follow-Up: At [facility], you and your health needs are our priority.  As part of our continuing mission to provide you with exceptional heart care, our providers are all part of one team.  This team includes your primary Cardiologist (physician) and Advanced Practice Providers or APPs (Physician Assistants and Nurse Practitioners) who all work together to provide you with the care you need, when you need it.  Your next appointment:   12 months  We recommend signing up for the patient portal called MyChart.  Sign up information is provided on this After Visit Summary.  MyChart is used to connect with patients for Virtual Visits (Telemedicine).  Patients are able to view lab/test results, encounter notes, upcoming appointments, etc.  Non-urgent messages can be sent to your provider as well.   To learn more about what you can do with MyChart, go to [email or web address].   Other Instructions

## 2024-02-27 ENCOUNTER — Ambulatory Visit
Admission: RE | Admit: 2024-02-27 | Discharge: 2024-02-27 | Disposition: A | Discharge status: Home / Self Care | Attending: Emergency Medicine | Admitting: Emergency Medicine

## 2024-02-27 ENCOUNTER — Other Ambulatory Visit: Payer: Self-pay

## 2024-02-27 ENCOUNTER — Ambulatory Visit (INDEPENDENT_AMBULATORY_CARE_PROVIDER_SITE_OTHER): Admitting: Radiology

## 2024-02-27 ENCOUNTER — Other Ambulatory Visit: Admitting: Radiology

## 2024-02-27 VITALS — BP 136/84 | HR 96 | Temp 98.9°F | Resp 18 | Ht 64.0 in | Wt 254.0 lb

## 2024-02-27 DIAGNOSIS — R051 Acute cough: Secondary | ICD-10-CM | POA: Diagnosis not present

## 2024-02-27 DIAGNOSIS — J101 Influenza due to other identified influenza virus with other respiratory manifestations: Secondary | ICD-10-CM

## 2024-02-27 LAB — POC COVID19/FLU A&B COMBO
Covid Antigen, POC: NEGATIVE
Influenza A Antigen, POC: POSITIVE — AB
Influenza B Antigen, POC: NEGATIVE

## 2024-02-27 MED ORDER — ALBUTEROL SULFATE HFA 108 (90 BASE) MCG/ACT IN AERS: 1.0000 | INHALATION_SPRAY | Freq: Four times a day (QID) | RESPIRATORY_TRACT | 0 refills | Status: AC | PRN

## 2024-02-27 MED ORDER — OSELTAMIVIR PHOSPHATE 75 MG PO CAPS: 75.0000 mg | ORAL_CAPSULE | Freq: Two times a day (BID) | ORAL | 0 refills | Status: AC

## 2024-02-27 MED ORDER — BENZONATATE 100 MG PO CAPS: 100.0000 mg | ORAL_CAPSULE | Freq: Three times a day (TID) | ORAL | 0 refills | Status: DC

## 2024-02-27 NOTE — ED Provider Notes (Signed)
 [facility]    CSN: [id number] Arrival date & time: 02/27/24  9076      History   Chief Complaint Chief Complaint  Patient presents with   Cough    Cough and low grade fever - Entered by patient   Fever    HPI Deanna [name] is a 68 y.o. female.   Patient presents to clinic over concern of cough, low-grade fever, mild chills and a little show central chest shortness of breath for the past 2 days.  Has been taking over-the-counter Tylenol and Aleve with improvement.  Has not had any recent sick contacts.  Has checked temperature at home and it has been 100.7 F.  Cough will be productive with clear mucus.  Has had mild congestion.  Denies sore throat.  Denies wheezing.  Reports she used to get this every year and had a albuterol  inhaler for this.  Has not had something similar in the past 3 years or so.  Did get her flu vaccine this year.  The history is provided by the patient and medical records.  Cough Fever   Past Medical History:  Diagnosis Date   Cardiomegaly    Diastasis recti    Edema    Elevated LDL cholesterol level    Hypertension    Hypothyroidism    Joint pain    LVH (left ventricular hypertrophy)    Morbid obesity (HCC)    Myalgia    Myositis    Osteoarthritis    OVERWEIGHT/OBESITY 12/20/2009   Qualifier: Diagnosis of  By: Edith, MD, CODY Debby JAYSON    Palpitations 12/20/2009   Qualifier: Diagnosis of  By: Edith, MD, CODY Debby C    Rhinitis    Rosacea    Shortness of breath 12/20/2009   Qualifier: Diagnosis of  By: Edith, MD, CODY Debby JAYSON    Situational stress    Thyroid  disease    THYROID  DISEASE, HX OF 12/20/2009   Qualifier: Diagnosis of  By: Edith, MD, CODY Debby JAYSON    Vitamin D deficiency     Patient Active Problem List   Diagnosis Date Noted   Aortic valve regurgitation due to aortic dilation 09/01/2021   Morbid obesity (HCC) 09/01/2021   LVH (left ventricular hypertrophy) 12/21/2013   Hyperlipidemia 10/19/2013    Family history of hypertrophic cardiomyopathy 10/19/2013   DOE (dyspnea on exertion) 10/19/2013   Essential hypertension 12/20/2009   CARDIOMEGALY 12/20/2009   SHORTNESS OF BREATH 12/20/2009   THYROID  DISEASE, HX OF 12/20/2009    Past Surgical History:  Procedure Laterality Date   CERVICAL FUSION     CHOLECYSTECTOMY     FOOT SURGERY     HAND SURGERY     TUBAL LIGATION      OB History   No obstetric history on file.      Home Medications    Prior to Admission medications   Medication Sig Start Date End Date Taking? Authorizing Provider  albuterol  (VENTOLIN  HFA) 108 (90 Base) MCG/ACT inhaler Inhale 1-2 puffs into the lungs every 6 (six) hours as needed for wheezing or shortness of breath. 02/27/24  Yes Salwa Bai  N, FNP  benzonatate  (TESSALON ) 100 MG capsule Take 1 capsule (100 mg total) by mouth every 8 (eight) hours. 02/27/24  Yes Glenn Christo  N, FNP  oseltamivir  (TAMIFLU ) 75 MG capsule Take 1 capsule (75 mg total) by mouth every 12 (twelve) hours. 02/27/24  Yes Georganna Maxson  N, FNP  hydrochlorothiazide (HYDRODIURIL) 25 MG tablet Take 25 mg by  mouth daily. 06/18/21   [provider]  levothyroxine (SYNTHROID) 125 MCG tablet daily before breakfast.    [provider]  losartan (COZAAR) 100 MG tablet Take 100 mg by mouth daily. 06/18/21   [provider]  metoprolol  tartrate (LOPRESSOR ) 25 MG tablet Take 0.5 tablets (12.5 mg total) by mouth 2 (two) times daily. 01/17/24   Chandrasekhar, Stanly A, MD  rosuvastatin  (CRESTOR ) 5 MG tablet Take 1 tablet (5 mg total) by mouth daily. 12/18/23   Chandrasekhar, Mahesh A, MD  tirzepatide (ZEPBOUND) 2.5 MG/0.5ML injection vial once a week. 02/11/24   [provider]    Family History Family History  Problem Relation Age of Onset   Congestive Heart Failure Father 48   Breast cancer Maternal Aunt    Diabetes Other    Hypertension Other    Hyperlipidemia Other    Asthma Other    Kidney  disease Other    Diverticulosis Other     Social History Social History   Tobacco Use   Smoking status: Never   Smokeless tobacco: Never  Vaping Use   Vaping status: Never Used  Substance Use Topics   Alcohol use: No   Drug use: Never     Allergies   Cyclobenzaprine and Diclofenac   Review of Systems Review of Systems  Per HPI  Physical Exam Triage Vital Signs ED Triage Vitals  Encounter Vitals Group     BP 02/27/24 0934 136/84     Girls Systolic BP Percentile --      Girls Diastolic BP Percentile --      Boys Systolic BP Percentile --      Boys Diastolic BP Percentile --      Pulse Rate 02/27/24 0934 96     Resp 02/27/24 0934 18     Temp 02/27/24 0934 98.9 F (37.2 C)     Temp Source 02/27/24 0934 Oral     SpO2 02/27/24 0934 93 %     Weight 02/27/24 0933 253 lb 15.5 oz (115.2 kg)     Height 02/27/24 0933 5' 4 (1.626 m)     Head Circumference --      Peak Flow --      Pain Score 02/27/24 0932 0     Pain Loc --      Pain Education --      Exclude from Growth Chart --    No data found.  Updated Vital Signs BP 136/84 (BP Location: Right Arm)   Pulse 96   Temp 98.9 F (37.2 C) (Oral)   Resp 18   Ht 5' 4 (1.626 m)   Wt 253 lb 15.5 oz (115.2 kg)   SpO2 93%   BMI 43.59 kg/m   Visual Acuity Right Eye Distance:   Left Eye Distance:   Bilateral Distance:    Right Eye Near:   Left Eye Near:    Bilateral Near:     Physical Exam Vitals and nursing note reviewed.  Constitutional:      Appearance: Normal appearance.  HENT:     Head: Normocephalic and atraumatic.     Right Ear: External ear normal.     Left Ear: External ear normal.     Nose: Nose normal.     Mouth/Throat:     Mouth: Mucous membranes are moist.     Pharynx: Posterior oropharyngeal erythema present.  Eyes:     Conjunctiva/sclera: Conjunctivae normal.  Cardiovascular:     Rate and Rhythm: Normal rate and regular  rhythm.     Heart sounds: Normal heart sounds. No murmur  heard. Pulmonary:     Effort: Pulmonary effort is normal. No respiratory distress.     Breath sounds: Normal breath sounds.  Neurological:     General: No focal deficit present.     Mental Status: She is alert.  Psychiatric:        Mood and Affect: Mood normal.      UC Treatments / Results  Labs (all labs ordered are listed, but only abnormal results are displayed) Labs Reviewed  POC COVID19/FLU A&B COMBO - Abnormal; Notable for the following components:      Result Value   Influenza A Antigen, POC Positive (*)    All other components within normal limits    EKG   Radiology DG Chest 2 View Result Date: 02/27/2024 CLINICAL DATA:  Shortness of breath. EXAM: CHEST - 2 VIEW COMPARISON:  04/27/2008 FINDINGS: Lungs are adequately inflated without focal airspace consolidation or effusion. Cardiomediastinal silhouette and remainder of the exam is unchanged. IMPRESSION: No active cardiopulmonary disease. Electronically Signed   By: Toribio Agreste M.D.   On: 02/27/2024 10:16    Procedures Procedures (including critical care time)  Medications Ordered in UC Medications - No data to display  Initial Impression / Assessment and Plan / UC Course  I have reviewed the triage vital signs and the nursing notes.  Pertinent labs & imaging results that were available during my care of the patient were reviewed by me and considered in my medical decision making (see chart for details).  Vitals and triage reviewed, patient is hemodynamically stable.  Lungs vesicular, heart with regular rate and rhythm.  Posterior pharynx with erythema.  POC COVID and flu testing positive for influenza A.  Did have flu vaccine this year.  Within window for Tamiflu, will start this medicine.  Chest x-ray obtained due to shortness of breath.  By my interpretation does not show acute changes or pneumonia, confirmed with radiology overread.  Will trial albuterol  for shortness of breath.  Cough management  discussed.  Tamiflu sent in for influenza.  Symptomatic management of viral illness discussed.  Plan of care, follow-up care and return precautions given, no questions at this time.    Final Clinical Impressions(s) / UC Diagnoses   Final diagnoses:  Acute cough  Influenza A     Discharge Instructions      Chest x-ray does not show pneumonia.  Your influenza testing was positive.  Have sent in Tamiflu, take this twice daily for the next 5 days to help you get over influenza faster.  You can use the Tessalon Perles every 8 hours to help suppress your cough.  Use the albuterol  inhaler for any wheezing or shortness of breath.  Sleep with a humidifier at night to help moisten any secretions and ensure you are drinking at least 64 ounces of water daily.  Alternating between Tylenol and ibuprofen can help with any fever, body aches or chills.  The flu is a viral illness and you should start feeling better over the next 5 days or so.  If no improvement or any changes return to clinic for reevaluation.      ED Prescriptions     Medication Sig Dispense Auth. Provider   oseltamivir (TAMIFLU) 75 MG capsule Take 1 capsule (75 mg total) by mouth every 12 (twelve) hours. 10 capsule Dreama, Wafaa Deemer  N, FNP   albuterol  (VENTOLIN  HFA) 108 (90 Base) MCG/ACT inhaler Inhale 1-2 puffs into the  lungs every 6 (six) hours as needed for wheezing or shortness of breath. 18 g Dreama, Caysen Whang  N, FNP   benzonatate (TESSALON) 100 MG capsule Take 1 capsule (100 mg total) by mouth every 8 (eight) hours. 21 capsule Dreama, Tayven Renteria  N, FNP      PDMP not reviewed this encounter.   Dreama, Malachai Schalk  N, FNP 02/27/24 1021

## 2024-02-27 NOTE — Discharge Instructions (Addendum)
 Chest x-ray does not show pneumonia.  Your influenza testing was positive.  Have sent in Tamiflu, take this twice daily for the next 5 days to help you get over influenza faster.  You can use the Tessalon Perles every 8 hours to help suppress your cough.  Use the albuterol  inhaler for any wheezing or shortness of breath.  Sleep with a humidifier at night to help moisten any secretions and ensure you are drinking at least 64 ounces of water daily.  Alternating between Tylenol and ibuprofen can help with any fever, body aches or chills.  The flu is a viral illness and you should start feeling better over the next 5 days or so.  If no improvement or any changes return to clinic for reevaluation.

## 2024-02-27 NOTE — ED Triage Notes (Signed)
 Pt presents with c/o cough and low grade fevers. Cough has been present for two days. Low grade fevers started yesterday. Does endorse little SOB. No pain. Has been taking OTC Tylenol + Aleve at home with improvement/relief. Denies sick contacts. Highest temperature at home has been 100.7 F.

## 2024-03-10 ENCOUNTER — Ambulatory Visit
Admission: RE | Admit: 2024-03-10 | Discharge: 2024-03-10 | Disposition: A | Discharge status: Home / Self Care | Source: Ambulatory Visit | Attending: Emergency Medicine | Admitting: Emergency Medicine

## 2024-03-10 ENCOUNTER — Ambulatory Visit: Admitting: Radiology

## 2024-03-10 VITALS — BP 176/83 | HR 85 | Temp 98.4°F | Resp 17

## 2024-03-10 DIAGNOSIS — R051 Acute cough: Secondary | ICD-10-CM

## 2024-03-10 DIAGNOSIS — J189 Pneumonia, unspecified organism: Secondary | ICD-10-CM

## 2024-03-10 MED ORDER — BENZONATATE 100 MG PO CAPS: 100.0000 mg | ORAL_CAPSULE | Freq: Three times a day (TID) | ORAL | 0 refills | Status: AC

## 2024-03-10 MED ORDER — AMOXICILLIN-POT CLAVULANATE 875-125 MG PO TABS: 1.0000 | ORAL_TABLET | Freq: Two times a day (BID) | ORAL | 0 refills | Status: AC

## 2024-03-10 MED ORDER — AZITHROMYCIN 250 MG PO TABS: ORAL_TABLET | ORAL | 0 refills | Status: AC

## 2024-03-10 NOTE — ED Triage Notes (Signed)
 Pt seen on 12/4 and positive for flu. Presents today due to consistent cough since then.

## 2024-03-10 NOTE — Discharge Instructions (Addendum)
 We are treating you for pneumonia with augmentin  and azithromycin  coverage. Take meds together as directed.  Any antibiotic can cause stomach upset, may take over-the-counter probiotic or yogurt while taking antibiotic Drink plenty of water Follow-up with PCP If you develop chest pain shortness of breath or worsening symptoms go to the emergency room for further evaluation

## 2024-03-10 NOTE — ED Provider Notes (Signed)
 [facility]    CSN: [id number] Arrival date & time: 03/10/24  1358      History   Chief Complaint Chief Complaint  Patient presents with   Cough    Continued cough after the flu. - Entered by patient    HPI Deanna [name] is a 68 y.o. female.   68 year old female, Paisely Brick, presents to urgent care for evaluation of persistent cough since being seen on 12 /4 /25 and testing positive for the flu.  Patient been taken over-the-counter meds without relief  The history is provided by the patient. No language interpreter was used.    Past Medical History:  Diagnosis Date   Cardiomegaly    Diastasis recti    Edema    Elevated LDL cholesterol level    Hypertension    Hypothyroidism    Joint pain    LVH (left ventricular hypertrophy)    Morbid obesity (HCC)    Myalgia    Myositis    Osteoarthritis    OVERWEIGHT/OBESITY 12/20/2009   Qualifier: Diagnosis of  By: Edith, MD, CODY Ned C    Palpitations 12/20/2009   Qualifier: Diagnosis of  By: Edith, MD, CODY Ned C    Rhinitis    Rosacea    Shortness of breath 12/20/2009   Qualifier: Diagnosis of  By: Edith, MD, CODY Ned JAYSON    Situational stress    Thyroid  disease    THYROID  DISEASE, HX OF 12/20/2009   Qualifier: Diagnosis of  By: Edith, MD, CODY Ned JAYSON    Vitamin D deficiency     Patient Active Problem List   Diagnosis Date Noted   Acute cough 03/10/2024   Pneumonia of right lower lobe due to infectious organism 03/10/2024   Aortic valve regurgitation due to aortic dilation 09/01/2021   Morbid obesity (HCC) 09/01/2021   LVH (left ventricular hypertrophy) 12/21/2013   Hyperlipidemia 10/19/2013   Family history of hypertrophic cardiomyopathy 10/19/2013   DOE (dyspnea on exertion) 10/19/2013   Essential hypertension 12/20/2009   CARDIOMEGALY 12/20/2009   SHORTNESS OF BREATH 12/20/2009   THYROID  DISEASE, HX OF 12/20/2009    Past Surgical History:  Procedure Laterality Date    CERVICAL FUSION     CHOLECYSTECTOMY     FOOT SURGERY     HAND SURGERY     TUBAL LIGATION      OB History   No obstetric history on file.      Home Medications    Prior to Admission medications  Medication Sig Start Date End Date Taking? Authorizing Provider  amoxicillin -clavulanate (AUGMENTIN ) 875-125 MG tablet Take 1 tablet by mouth 2 (two) times daily for 5 days. 03/10/24 03/15/24 Yes Deep Bonawitz, NP  azithromycin  (ZITHROMAX  Z-PAK) 250 MG tablet Take as package directed 03/10/24  Yes Perry Molla, Rilla, NP  albuterol  (VENTOLIN  HFA) 108 (90 Base) MCG/ACT inhaler Inhale 1-2 puffs into the lungs every 6 (six) hours as needed for wheezing or shortness of breath. 02/27/24   Dreama, [place]  N, FNP  benzonatate  (TESSALON ) 100 MG capsule Take 1 capsule (100 mg total) by mouth every 8 (eight) hours. 03/10/24   Gustavo Dispenza, NP  hydrochlorothiazide (HYDRODIURIL) 25 MG tablet Take 25 mg by mouth daily. 06/18/21   [provider]  levothyroxine (SYNTHROID) 125 MCG tablet daily before breakfast.    [provider]  losartan (COZAAR) 100 MG tablet Take 100 mg by mouth daily. 06/18/21   [provider]  metoprolol  tartrate (LOPRESSOR ) 25 MG tablet Take 0.5 tablets (  12.5 mg total) by mouth 2 (two) times daily. 01/17/24   Chandrasekhar, Stanly LABOR, MD  oseltamivir  (TAMIFLU ) 75 MG capsule Take 1 capsule (75 mg total) by mouth every 12 (twelve) hours. 02/27/24   Dreama, [place]  N, FNP  rosuvastatin  (CRESTOR ) 5 MG tablet Take 1 tablet (5 mg total) by mouth daily. 12/18/23   Chandrasekhar, Mahesh A, MD  tirzepatide (ZEPBOUND) 2.5 MG/0.5ML injection vial once a week. 02/11/24   [provider]    Family History Family History  Problem Relation Age of Onset   Congestive Heart Failure Father 58   Breast cancer Maternal Aunt    Diabetes Other    Hypertension Other    Hyperlipidemia Other    Asthma Other    Kidney disease Other    Diverticulosis Other      Social History Social History[1]   Allergies   Cyclobenzaprine and Diclofenac   Review of Systems Review of Systems  Constitutional:  Negative for fever.  HENT:  Positive for congestion.   Respiratory:  Positive for cough. Negative for shortness of breath, wheezing and stridor.   Cardiovascular:  Negative for chest pain and palpitations.  All other systems reviewed and are negative.    Physical Exam Triage Vital Signs ED Triage Vitals  Encounter Vitals Group     BP 03/10/24 1413 (!) 176/83     Girls Systolic BP Percentile --      Girls Diastolic BP Percentile --      Boys Systolic BP Percentile --      Boys Diastolic BP Percentile --      Pulse Rate 03/10/24 1413 85     Resp 03/10/24 1413 17     Temp 03/10/24 1413 98.4 F (36.9 C)     Temp Source 03/10/24 1413 Oral     SpO2 03/10/24 1413 93 %     Weight --      Height --      Head Circumference --      Peak Flow --      Pain Score 03/10/24 1416 0     Pain Loc --      Pain Education --      Exclude from Growth Chart --    No data found.  Updated Vital Signs BP (!) 176/83 (BP Location: Right Arm)   Pulse 85   Temp 98.4 F (36.9 C) (Oral)   Resp 17   SpO2 93%   Visual Acuity Right Eye Distance:   Left Eye Distance:   Bilateral Distance:    Right Eye Near:   Left Eye Near:    Bilateral Near:     Physical Exam Vitals and nursing note reviewed.  Constitutional:      General: She is not in acute distress.    Appearance: She is well-developed.  HENT:     Head: Normocephalic.     Right Ear: Tympanic membrane is retracted.     Left Ear: Tympanic membrane is retracted.     Nose: Congestion present.     Mouth/Throat:     Lips: Pink.     Mouth: Mucous membranes are moist.     Pharynx: Oropharynx is clear.  Eyes:     General: Lids are normal.     Conjunctiva/sclera: Conjunctivae normal.     Pupils: Pupils are equal, round, and reactive to light.  Neck:     Trachea: No tracheal deviation.   Cardiovascular:     Rate and Rhythm: Normal rate and regular rhythm.  Heart sounds: Normal heart sounds. No murmur heard. Pulmonary:     Effort: Pulmonary effort is normal.     Breath sounds: Normal breath sounds and air entry.  Abdominal:     General: Bowel sounds are normal.     Palpations: Abdomen is soft.     Tenderness: There is no abdominal tenderness.  Musculoskeletal:        General: Normal range of motion.     Cervical back: Normal range of motion.  Lymphadenopathy:     Cervical: No cervical adenopathy.  Skin:    General: Skin is warm and dry.     Findings: No rash.  Neurological:     General: No focal deficit present.     Mental Status: She is alert and oriented to person, place, and time.     GCS: GCS eye subscore is 4. GCS verbal subscore is 5. GCS motor subscore is 6.  Psychiatric:        Speech: Speech normal.        Behavior: Behavior normal. Behavior is cooperative.      UC Treatments / Results  Labs (all labs ordered are listed, but only abnormal results are displayed) Labs Reviewed - No data to display  EKG   Radiology DG Chest 2 View Result Date: 03/10/2024 CLINICAL DATA:  Cough. EXAM: CHEST - 2 VIEW COMPARISON:  Chest radiograph dated 02/27/2024. FINDINGS: Faint right infrahilar density, likely atelectasis. Developing infiltrate is less likely. No consolidative changes. There is no pleural effusion pneumothorax. The cardiac silhouette is within normal limits. No acute osseous pathology. IMPRESSION: Right infrahilar atelectasis. No consolidative changes. Electronically Signed   By: Vanetta Chou M.D.   On: 03/10/2024 14:59    Procedures Procedures (including critical care time)  Medications Ordered in UC Medications - No data to display  Initial Impression / Assessment and Plan / UC Course  I have reviewed the triage vital signs and the nursing notes.  Pertinent labs & imaging results that were available during my care of the patient were  reviewed by me and considered in my medical decision making (see chart for details).    Discussed exam findings and plan of care with patient, Augmentin  /azithromycin  antibiotic scripted as well as Tessalon  for cough , push fluids ,strict go to ER precautions given.   Patient verbalized understanding to this provider.  Ddx: Right lower lobe pneumonia, acute cough, allergies, viral illness Final Clinical Impressions(s) / UC Diagnoses   Final diagnoses:  Acute cough  Pneumonia of right lower lobe due to infectious organism     Discharge Instructions      We are treating you for pneumonia with augmentin  and azithromycin  coverage. Take meds together as directed.  Any antibiotic can cause stomach upset, may take over-the-counter probiotic or yogurt while taking antibiotic Drink plenty of water Follow-up with PCP If you develop chest pain shortness of breath or worsening symptoms go to the emergency room for further evaluation      ED Prescriptions     Medication Sig Dispense Auth. Provider   amoxicillin -clavulanate (AUGMENTIN ) 875-125 MG tablet Take 1 tablet by mouth 2 (two) times daily for 5 days. 10 tablet Andrus Sharp, NP   azithromycin  (ZITHROMAX  Z-PAK) 250 MG tablet Take as package directed 6 tablet Karrington Studnicka, NP   benzonatate  (TESSALON ) 100 MG capsule Take 1 capsule (100 mg total) by mouth every 8 (eight) hours. 21 capsule Haiden Clucas, NP      PDMP not reviewed this encounter.     [  1]  Social History Tobacco Use   Smoking status: Never   Smokeless tobacco: Never  Vaping Use   Vaping status: Never Used  Substance Use Topics   Alcohol use: No   Drug use: Never     Marja Adderley, Rilla, NP 03/10/24 1637

## 2024-03-27 ENCOUNTER — Ambulatory Visit: Admitting: Internal Medicine

## 2024-04-12 ENCOUNTER — Other Ambulatory Visit: Payer: Self-pay | Admitting: Internal Medicine

## 2024-05-01 ENCOUNTER — Other Ambulatory Visit: Payer: Self-pay | Admitting: Obstetrics and Gynecology

## 2024-05-01 DIAGNOSIS — Z1231 Encounter for screening mammogram for malignant neoplasm of breast: Secondary | ICD-10-CM

## 2024-05-08 ENCOUNTER — Ambulatory Visit
